# Patient Record
Sex: Female | Born: 1980 | Race: White | Hispanic: Yes | Marital: Married | Smoking: Never smoker
Health system: Southern US, Community
[De-identification: ages and names within clinical notes are randomized; demographics above are authoritative.]

## PROBLEM LIST (undated history)

## (undated) ENCOUNTER — Inpatient Hospital Stay (HOSPITAL_COMMUNITY): Payer: Self-pay

## (undated) DIAGNOSIS — B999 Unspecified infectious disease: Secondary | ICD-10-CM

## (undated) DIAGNOSIS — R87610 Atypical squamous cells of undetermined significance on cytologic smear of cervix (ASC-US): Secondary | ICD-10-CM

## (undated) DIAGNOSIS — K8020 Calculus of gallbladder without cholecystitis without obstruction: Secondary | ICD-10-CM

## (undated) DIAGNOSIS — R102 Pelvic and perineal pain: Secondary | ICD-10-CM

## (undated) DIAGNOSIS — M5136 Other intervertebral disc degeneration, lumbar region: Secondary | ICD-10-CM

## (undated) DIAGNOSIS — M940 Chondrocostal junction syndrome [Tietze]: Secondary | ICD-10-CM

## (undated) DIAGNOSIS — M7918 Myalgia, other site: Secondary | ICD-10-CM

## (undated) HISTORY — DX: Myalgia, other site: M79.18

## (undated) HISTORY — PX: REFRACTIVE SURGERY: SHX103

## (undated) HISTORY — PX: WISDOM TOOTH EXTRACTION: SHX21

## (undated) HISTORY — DX: Other intervertebral disc degeneration, lumbar region: M51.36

## (undated) HISTORY — DX: Calculus of gallbladder without cholecystitis without obstruction: K80.20

## (undated) HISTORY — DX: Pelvic and perineal pain: R10.2

## (undated) HISTORY — DX: Atypical squamous cells of undetermined significance on cytologic smear of cervix (ASC-US): R87.610

## (undated) HISTORY — PX: NO PAST SURGERIES: SHX2092

## (undated) HISTORY — DX: Chondrocostal junction syndrome (tietze): M94.0

## (undated) HISTORY — PX: EYE SURGERY: SHX253

---

## 2009-07-24 ENCOUNTER — Emergency Department (HOSPITAL_COMMUNITY): Admission: EM | Admit: 2009-07-24 | Discharge: 2009-07-24 | Payer: Self-pay | Admitting: Emergency Medicine

## 2009-11-02 ENCOUNTER — Inpatient Hospital Stay (HOSPITAL_COMMUNITY): Admission: AD | Admit: 2009-11-02 | Discharge: 2009-11-04 | Payer: Self-pay | Admitting: Obstetrics and Gynecology

## 2009-12-05 ENCOUNTER — Ambulatory Visit: Admission: RE | Admit: 2009-12-05 | Discharge: 2009-12-05 | Payer: Self-pay | Admitting: Obstetrics and Gynecology

## 2010-04-17 LAB — CBC
HCT: 34.6 % — ABNORMAL LOW (ref 36.0–46.0)
Hemoglobin: 12.3 g/dL (ref 12.0–15.0)
MCHC: 33.1 g/dL (ref 30.0–36.0)
Platelets: 166 10*3/uL (ref 150–400)
Platelets: 191 10*3/uL (ref 150–400)
RDW: 13.7 % (ref 11.5–15.5)
WBC: 15.9 10*3/uL — ABNORMAL HIGH (ref 4.0–10.5)

## 2010-04-17 LAB — RPR: RPR Ser Ql: NONREACTIVE

## 2011-06-11 ENCOUNTER — Telehealth: Payer: Self-pay | Admitting: Obstetrics and Gynecology

## 2011-06-11 NOTE — Telephone Encounter (Signed)
Pt co of pelvic pain mainly rt side started in Nov usually worse @ time of cycle but she is mid cycle now and getting worse no abn blding or d/c no problems with urination very concerned.advised pt she could try ibuprofen and we can schedule an appt forr eval scheduled with EP at 8:45a tomorrow [id number]

## 2011-06-12 ENCOUNTER — Ambulatory Visit (INDEPENDENT_AMBULATORY_CARE_PROVIDER_SITE_OTHER): Payer: Self-pay | Admitting: Obstetrics and Gynecology

## 2011-06-12 ENCOUNTER — Encounter: Payer: Self-pay | Admitting: Obstetrics and Gynecology

## 2011-06-12 VITALS — BP 100/70 | Temp 98.6°F | Ht 67.0 in | Wt 190.0 lb

## 2011-06-12 DIAGNOSIS — R102 Pelvic and perineal pain: Secondary | ICD-10-CM

## 2011-06-12 DIAGNOSIS — R21 Rash and other nonspecific skin eruption: Secondary | ICD-10-CM

## 2011-06-12 DIAGNOSIS — N949 Unspecified condition associated with female genital organs and menstrual cycle: Secondary | ICD-10-CM

## 2011-06-12 LAB — POCT URINALYSIS DIPSTICK
Ketones, UA: NEGATIVE
Leukocytes, UA: NEGATIVE
Nitrite, UA: NEGATIVE
Protein, UA: NEGATIVE
Urobilinogen, UA: NEGATIVE

## 2011-06-12 LAB — POCT URINE PREGNANCY: Preg Test, Ur: NEGATIVE

## 2011-06-12 NOTE — Patient Instructions (Signed)
Schedule follow up visit after ultrasound

## 2011-06-12 NOTE — Progress Notes (Signed)
Vaginal Discharge: no Kidney Stones: no Prior Eval: no  Odor: no Constipation: no Prior U/S: no  Fever: no Diarrhea: no Hx of Ovarian Cyst: no  Irreg. Periods: no Rectal Bleeding: no Hx of STD-PID: no  Dyspareunia: no Vomiting: no Appendectomy: no   Dysuria: no Nausea: no Gall Bladder Ds: no  Frequency: no Pregnant: no Other: C/O RIGHT SIDE PAIN  Urgency: no Fibroids: no   Hematuria: no Endometriosis: no

## 2011-06-12 NOTE — Progress Notes (Addendum)
30 YO complains of 1 year history of pelvic pain. Descirbes the pain as achy, off & on, mostly on the right and worse with full bladder and certain movements (sudden or bending down).  Denies changes on bowel movements, dyspareunia, back pain, or dysuria.  Hasn't taken medication for the pain and at times associated with menses lasting several days. Rates the pain as 7-8/10.  Denies urgency, frequency or nocturia.  Admits to holding urine often. Lastly for the past 6 months has had an itchy rash on inside of left lower leg.  Has not responded to hydrocortisone.  O: Abdomen: soft, non-tender, no organomegaly      Pelvic: EGBUS-wnl, vagina-normal rugae, cervix-no       tenderness, uterus-normal size, adnexae-no tender-      ness or masses         Back: no CVA tenderness     Left lower leg: medially a raised confluent mauve colored     rash in somewhat linear distribution but spanning a 3 cm     area.  U/A: negative UPT: negative  A:  Chronic Pelvic Pain       Left leg rash  P: Reviewed causes of pelvic pain     Pelvic Ultrasound to r/o pelvic masses     Refer to dermatiologist  for left  leg rash

## 2011-06-12 NOTE — Progress Notes (Signed)
Addended by: Henreitta Leber on: 06/12/2011 09:32 AM   Modules accepted: Orders

## 2011-06-23 ENCOUNTER — Ambulatory Visit (INDEPENDENT_AMBULATORY_CARE_PROVIDER_SITE_OTHER): Payer: Self-pay

## 2011-06-23 ENCOUNTER — Ambulatory Visit (INDEPENDENT_AMBULATORY_CARE_PROVIDER_SITE_OTHER): Payer: Self-pay | Admitting: Obstetrics and Gynecology

## 2011-06-23 ENCOUNTER — Other Ambulatory Visit: Payer: Self-pay | Admitting: Obstetrics and Gynecology

## 2011-06-23 ENCOUNTER — Encounter: Payer: Self-pay | Admitting: Obstetrics and Gynecology

## 2011-06-23 VITALS — BP 110/72 | Temp 98.5°F | Ht 67.0 in | Wt 190.0 lb

## 2011-06-23 DIAGNOSIS — N949 Unspecified condition associated with female genital organs and menstrual cycle: Secondary | ICD-10-CM

## 2011-06-23 DIAGNOSIS — R102 Pelvic and perineal pain: Secondary | ICD-10-CM

## 2011-06-23 DIAGNOSIS — G8929 Other chronic pain: Secondary | ICD-10-CM | POA: Insufficient documentation

## 2011-06-23 MED ORDER — NORGESTIMATE-ETH ESTRADIOL 0.25-35 MG-MCG PO TABS: 1.0000 | ORAL_TABLET | Freq: Every day | ORAL | Status: DC

## 2011-06-23 NOTE — Progress Notes (Signed)
30 YO seen 06/12/11 for chronic pelvic pain returns for ultrasound follow up.    U/S-wnl  A: Chronic Pelvic Pain  P: Discontinue Micronor and  begin with next cycle, Sprintec     #1 1 po qd 11 refills (patient wanted to switch due to cost     of current pills and because she has done well with      decreased flow when she was on Low-Ogestrel     Reviewed causes of pelvic pain: urogenital, previous              surgery, gastrointestinal and musculoskeletal.      Given information to obtain a PCP, follow up with PCP     for pelvic pain  Amiyah Shryock, PA-C

## 2012-07-06 ENCOUNTER — Encounter (INDEPENDENT_AMBULATORY_CARE_PROVIDER_SITE_OTHER): Payer: Self-pay

## 2012-07-08 ENCOUNTER — Ambulatory Visit (INDEPENDENT_AMBULATORY_CARE_PROVIDER_SITE_OTHER): Payer: 59 | Admitting: General Surgery

## 2012-07-08 ENCOUNTER — Encounter (INDEPENDENT_AMBULATORY_CARE_PROVIDER_SITE_OTHER): Payer: Self-pay | Admitting: General Surgery

## 2012-07-08 ENCOUNTER — Telehealth (INDEPENDENT_AMBULATORY_CARE_PROVIDER_SITE_OTHER): Payer: Self-pay | Admitting: General Surgery

## 2012-07-08 VITALS — BP 110/70 | HR 71 | Temp 98.9°F | Resp 16 | Ht 67.0 in | Wt 191.6 lb

## 2012-07-08 DIAGNOSIS — R1030 Lower abdominal pain, unspecified: Secondary | ICD-10-CM

## 2012-07-08 DIAGNOSIS — R109 Unspecified abdominal pain: Secondary | ICD-10-CM

## 2012-07-08 NOTE — Telephone Encounter (Signed)
Faxed med release to [facility] [phone] to received CT scan disc itself..told that we should received in mail sometime next week

## 2012-07-08 NOTE — Telephone Encounter (Signed)
LMOM call me back today 6/6 to see if patient could maybe come in this a.m instead of this p.m.

## 2012-07-08 NOTE — Progress Notes (Signed)
Patient ID: Erika Gay, female   DOB: April 29, 1980, 32 y.o.   MRN: [id number]  Chief Complaint  Patient presents with  . New Evaluation    evaluate abd pain / ? hernia    HPI Erika Gay is a 32 y.o. female.  The patient is a 32 year old female who is referred by Dr. Emily Filbert for an evaluation of a potential right inguinal hernia. The patient states she has had approximately a one year and one half year history of right inguinal pain. She states the pain is increased at the time of her monthly cycles. She stated that recently the pain has been more consistent even after cycles are over.  Additional states that her pain is increased with lifting something heavy sneezing and/or coughing. The patient underwent a CT scan which I did not half-year office her the report does not mention any fibroids and is not comment on any hernia in the right inguinal area. The patient states she's never had any back symptoms and or problems and has never seen a spine doctor. HPI  Past Medical History  Diagnosis Date  . Pelvic pain     Past Surgical History  Procedure Laterality Date  . Wisdom tooth extraction    . Refractive surgery      Family History  Problem Relation Age of Onset  . Hypertension Mother   . Diabetes Other     Social History History  Substance Use Topics  . Smoking status: Never Smoker   . Smokeless tobacco: Never Used  . Alcohol Use: No    No Known Allergies  Current Outpatient Prescriptions  Medication Sig Dispense Refill  . Multiple Vitamin (MULTIVITAMIN) tablet Take 1 tablet by mouth daily.      . naproxen (NAPROSYN) 500 MG tablet Take 500 mg by mouth 2 (two) times daily with a meal.      . norgestimate-ethinyl estradiol (SPRINTEC 28) 0.25-35 MG-MCG tablet Take 1 tablet by mouth daily.  1 Package  11   No current facility-administered medications for this visit.    Review of Systems Review of Systems  Constitutional: Negative.   HENT: Negative.    Respiratory: Negative.   Cardiovascular: Negative.   Gastrointestinal: Negative.   Endocrine: Negative.   Neurological: Negative.   All other systems reviewed and are negative.    Blood pressure 110/70, pulse 71, temperature 98.9 F (37.2 C), temperature source Temporal, resp. rate 16, height 5\' 7"  (1.702 m), weight 191 lb 9.6 oz (86.909 kg), SpO2 100.00%.  Physical Exam Physical Exam  Constitutional: She is oriented to person, place, and time. She appears well-developed and well-nourished.  HENT:  Head: Normocephalic and atraumatic.  Eyes: Conjunctivae and EOM are normal. Pupils are equal, round, and reactive to light.  Neck: Normal range of motion. Neck supple.  Cardiovascular: Normal rate, regular rhythm and normal heart sounds.   Pulmonary/Chest: Effort normal and breath sounds normal.  Abdominal: Soft. Bowel sounds are normal. No hernia. Hernia confirmed negative in the right inguinal area and confirmed negative in the left inguinal area.  Musculoskeletal: Normal range of motion.  Neurological: She is alert and oriented to person, place, and time.  Skin: Skin is warm and dry.    Data Reviewed CT scan report reviewed. No CT CD and hand  Assessment    32 year old female with right inguinal pain. I not feel a hernia on exam. Due to the patient's symptomatology and correlation with her monthly cycles it is possible she could also he had issues with  endometriosis.    Plan     1. We will have the CD sent here for review of possible hernia in the right inguinal canal.should this be negative I think the best course would be for the patient is a GYN physician can be worked up and potentially treated for endometriosis. The patient understands and is not want unnecessary operation for a hernia if it's not there.  2. I will call the patient back after reviewing the CT scan.       Marigene Ehlers., Naz Denunzio 07/08/2012, 3:53 PM

## 2012-07-15 ENCOUNTER — Ambulatory Visit (INDEPENDENT_AMBULATORY_CARE_PROVIDER_SITE_OTHER): Payer: 59 | Admitting: Surgery

## 2012-07-18 ENCOUNTER — Telehealth (INDEPENDENT_AMBULATORY_CARE_PROVIDER_SITE_OTHER): Payer: Self-pay | Admitting: General Surgery

## 2012-07-18 NOTE — Telephone Encounter (Signed)
faxed new med rel to pt to sign and fax back to me so that I can refax to [facility] so that they can mail the CT disc to [place]..fax number for pt is [phone]...spoke with Arline Asp from [facility] to ask why we had not yet received the CT and she stated that they had not mailed it because they did not show a med rel in their system...original med rel sent to med recds to be scanned in with confirmation from 07/08/12

## 2012-07-18 NOTE — Telephone Encounter (Signed)
error 

## 2012-07-18 NOTE — Telephone Encounter (Signed)
Patient called in and stated that she had been on the phone with [facility] and they had stated they found the med release that was faxed over on 6/6 and that they were mailing the CT out today and we should have it either 6/17 or 6/18.Marland Kitchenpt is aware that we will call once we receive and review the images..patient is agreeable

## 2012-07-18 NOTE — Telephone Encounter (Signed)
Error

## 2012-07-25 ENCOUNTER — Telehealth (INDEPENDENT_AMBULATORY_CARE_PROVIDER_SITE_OTHER): Payer: Self-pay | Admitting: General Surgery

## 2012-07-25 NOTE — Telephone Encounter (Signed)
Patient called asking if result were in from her CT. I did advise patient we just received the disc, Dr.Ramirez will be in 07/26/2012 to review. Patient agreeable.

## 2012-07-26 ENCOUNTER — Telehealth (INDEPENDENT_AMBULATORY_CARE_PROVIDER_SITE_OTHER): Payer: Self-pay | Admitting: General Surgery

## 2012-07-26 NOTE — Telephone Encounter (Signed)
I called the patient after reviewing her CT scan which reveals no right inguinal hernia. Patient states at this point she has no pain in her abdomen and right lower inguinal area.  At this time the best course of actually be for her to visit with her GYN positions is any medications that can help her with possible endometriosis treatment. Again the patient did not 1 have any undue surgery especially for hernia she did have one which we ruled out review the CT scan.

## 2013-12-04 ENCOUNTER — Encounter (INDEPENDENT_AMBULATORY_CARE_PROVIDER_SITE_OTHER): Payer: Self-pay | Admitting: General Surgery

## 2014-11-02 ENCOUNTER — Emergency Department (HOSPITAL_COMMUNITY): Payer: 59

## 2014-11-02 ENCOUNTER — Emergency Department (HOSPITAL_COMMUNITY)
Admission: EM | Admit: 2014-11-02 | Discharge: 2014-11-02 | Disposition: A | Discharge status: Home / Self Care | Payer: 59 | Attending: Emergency Medicine | Admitting: Emergency Medicine

## 2014-11-02 ENCOUNTER — Encounter (HOSPITAL_COMMUNITY): Payer: Self-pay | Admitting: Emergency Medicine

## 2014-11-02 DIAGNOSIS — S4991XA Unspecified injury of right shoulder and upper arm, initial encounter: Secondary | ICD-10-CM | POA: Diagnosis not present

## 2014-11-02 DIAGNOSIS — M545 Low back pain, unspecified: Secondary | ICD-10-CM

## 2014-11-02 DIAGNOSIS — M25531 Pain in right wrist: Secondary | ICD-10-CM

## 2014-11-02 DIAGNOSIS — Z791 Long term (current) use of non-steroidal anti-inflammatories (NSAID): Secondary | ICD-10-CM | POA: Insufficient documentation

## 2014-11-02 DIAGNOSIS — S8001XA Contusion of right knee, initial encounter: Secondary | ICD-10-CM | POA: Diagnosis not present

## 2014-11-02 DIAGNOSIS — S199XXA Unspecified injury of neck, initial encounter: Secondary | ICD-10-CM | POA: Diagnosis present

## 2014-11-02 DIAGNOSIS — Z79899 Other long term (current) drug therapy: Secondary | ICD-10-CM | POA: Diagnosis not present

## 2014-11-02 DIAGNOSIS — M25561 Pain in right knee: Secondary | ICD-10-CM

## 2014-11-02 DIAGNOSIS — S3992XA Unspecified injury of lower back, initial encounter: Secondary | ICD-10-CM | POA: Diagnosis not present

## 2014-11-02 DIAGNOSIS — Y998 Other external cause status: Secondary | ICD-10-CM | POA: Insufficient documentation

## 2014-11-02 DIAGNOSIS — Y9389 Activity, other specified: Secondary | ICD-10-CM | POA: Diagnosis not present

## 2014-11-02 DIAGNOSIS — S6991XA Unspecified injury of right wrist, hand and finger(s), initial encounter: Secondary | ICD-10-CM | POA: Diagnosis not present

## 2014-11-02 DIAGNOSIS — Z3202 Encounter for pregnancy test, result negative: Secondary | ICD-10-CM | POA: Insufficient documentation

## 2014-11-02 DIAGNOSIS — S161XXA Strain of muscle, fascia and tendon at neck level, initial encounter: Secondary | ICD-10-CM

## 2014-11-02 DIAGNOSIS — Y92410 Unspecified street and highway as the place of occurrence of the external cause: Secondary | ICD-10-CM | POA: Insufficient documentation

## 2014-11-02 LAB — POC URINE PREG, ED: Preg Test, Ur: NEGATIVE

## 2014-11-02 MED ORDER — NAPROXEN 500 MG PO TABS: 500.0000 mg | ORAL_TABLET | Freq: Two times a day (BID) | ORAL | Status: DC

## 2014-11-02 MED ORDER — CYCLOBENZAPRINE HCL 10 MG PO TABS: 10.0000 mg | ORAL_TABLET | Freq: Two times a day (BID) | ORAL | Status: DC | PRN

## 2014-11-02 NOTE — ED Notes (Signed)
Patient transported to X-ray 
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
## 2014-11-09 ENCOUNTER — Ambulatory Visit (INDEPENDENT_AMBULATORY_CARE_PROVIDER_SITE_OTHER): Payer: 59 | Admitting: Physician Assistant

## 2014-11-09 VITALS — BP 104/80 | HR 101 | Temp 98.2°F | Resp 16 | Ht 66.5 in | Wt 199.8 lb

## 2014-11-09 DIAGNOSIS — Z23 Encounter for immunization: Secondary | ICD-10-CM

## 2014-11-09 DIAGNOSIS — Z111 Encounter for screening for respiratory tuberculosis: Secondary | ICD-10-CM | POA: Diagnosis not present

## 2014-11-09 NOTE — Progress Notes (Signed)

## 2014-11-09 NOTE — Patient Instructions (Signed)
You received the flu vaccine today.  We placed the TB skin screening test today. You'll need to return in 48-72 hours to have this test read.   Tuberculin Skin Test WHY AM I HAVING THIS TEST? Tuberculosis (TB) is a bacterial infection caused by Mycobacterium tuberculosis. Most people who are exposed to these bacteria have a strong enough defense (immune) system to prevent the bacteria from causing TB and developing symptoms. Their bodies prevent the germs from being active and making them sick (latent TB infection).  However, if you have TB germs in your body and your immune system is weak, you can develop a TB infection. This can cause symptoms such as:   Night sweats.  Fever.  Weakness.  Weight loss. A latent TB infection can also become active later in life if your immune system becomes weakened or compromised. You may have this test if your health care provider suspects that you have TB. You may also have this test to screen for TB if you are at risk for getting the disease. Those at increased risk include:  People who inject illegal drugs or share needles.  People with HIV or other diseases that affect immunity.  Health care workers.  People who live in high-risk communities, such as homeless shelters, nursing homes, and correctional facilities.  People who have been in contact with someone with TB.  People from countries where TB is more common. If you are in a high-risk group, your health care provider may wish to screen for TB more often. This can help prevent the spread of the disease. Sometimes TB screening is required when starting a new job, such as becoming a Scientist, forensic or a Runner, broadcasting/film/video. Colleges or universities may require it of new students. HOW WILL I BE TESTED? A tuberculin skin test is the main test used to check for exposure to the bacteria that can cause TB. The test checks for antibodies to the bacteria. Antibodies are proteins that your body produces to  protect you from germs and other things that can make you sick. Your health care provider will inject a solution known as PPD (purified protein derivative) under the first layer of skin on your arm. This causes a blister-like bubble to form at the site. Your health care provider will then examine the site after a number of hours have passed to see if a reaction has occurred. HOW DO I PREPARE FOR THE TEST? There is no preparation required for this test. WHAT DO THE RESULTS MEAN? Your test results will be reported as either negative or positive.  If the tuberculin skin test produces a negative result, it is likely that you do not have TB and have not been exposed to the TB bacteria. If you or your health care provider suspects exposure, however, you may want to repeat the test a few weeks later. A blood test may also be used to check for TB. This is because you will not react to the tuberculin skin test until several weeks after exposure to TB bacteria. If you test positive to the tuberculin skin test, it is likely that you have been exposed to TB bacteria. The test does not distinguish between an active and a latent TB infection. A false-positive result can occur. A false-positive result for TB bacteria is incorrect because it indicates a condition or finding is present when it is not. Talk to your health care provider to discuss your results, treatment options, and if necessary, the need for more  tests. It is your responsibility to obtain your test results. Ask the lab or department performing the test when and how you will get your results. Talk with your health care provider if you have any questions about your results.   This information is not intended to replace advice given to you by your health care provider. Make sure you discuss any questions you have with your health care provider.   Document Released: 10/29/2004 Document Revised: 02/09/2014 Document Reviewed: 05/15/2013 Elsevier  Interactive Patient Education 2016 Elsevier Inc.   Influenza Virus Vaccine (Flucelvax) What is this medicine? INFLUENZA VIRUS VACCINE (in floo EN zuh VAHY ruhs vak SEEN) helps to reduce the risk of getting influenza also known as the flu. The vaccine only helps protect you against some strains of the flu. This medicine may be used for other purposes; ask your health care provider or pharmacist if you have questions. What should I tell my health care provider before I take this medicine? They need to know if you have any of these conditions: -bleeding disorder like hemophilia -fever or infection -Guillain-Barre syndrome or other neurological problems -immune system problems -infection with the human immunodeficiency virus (HIV) or AIDS -low blood platelet counts -multiple sclerosis -an unusual or allergic reaction to influenza virus vaccine, other medicines, foods, dyes or preservatives -pregnant or trying to get pregnant -breast-feeding How should I use this medicine? This vaccine is for injection into a muscle. It is given by a health care professional. A copy of Vaccine Information Statements will be given before each vaccination. Read this sheet carefully each time. The sheet may change frequently. Talk to your pediatrician regarding the use of this medicine in children. Special care may be needed. Overdosage: If you think you've taken too much of this medicine contact a poison control center or emergency room at once. Overdosage: If you think you have taken too much of this medicine contact a poison control center or emergency room at once. NOTE: This medicine is only for you. Do not share this medicine with others. What if I miss a dose? This does not apply. What may interact with this medicine? -chemotherapy or radiation therapy -medicines that lower your immune system like etanercept, anakinra, infliximab, and adalimumab -medicines that treat or prevent blood clots like  warfarin -phenytoin -steroid medicines like prednisone or cortisone -theophylline -vaccines This list may not describe all possible interactions. Give your health care provider a list of all the medicines, herbs, non-prescription drugs, or dietary supplements you use. Also tell them if you smoke, drink alcohol, or use illegal drugs. Some items may interact with your medicine. What should I watch for while using this medicine? Report any side effects that do not go away within 3 days to your doctor or health care professional. Call your health care provider if any unusual symptoms occur within 6 weeks of receiving this vaccine. You may still catch the flu, but the illness is not usually as bad. You cannot get the flu from the vaccine. The vaccine will not protect against colds or other illnesses that may cause fever. The vaccine is needed every year. What side effects may I notice from receiving this medicine? Side effects that you should report to your doctor or health care professional as soon as possible: -allergic reactions like skin rash, itching or hives, swelling of the face, lips, or tongue Side effects that usually do not require medical attention (Report these to your doctor or health care professional if they continue or are  bothersome.): -fever -headache -muscle aches and pains -pain, tenderness, redness, or swelling at the injection site -tiredness This list may not describe all possible side effects. Call your doctor for medical advice about side effects. You may report side effects to FDA at 1-800-FDA-1088. Where should I keep my medicine? The vaccine will be given by a health care professional in a clinic, pharmacy, doctor's office, or other health care setting. You will not be given vaccine doses to store at home. NOTE: This sheet is a summary. It may not cover all possible information. If you have questions about this medicine, talk to your doctor, pharmacist, or health care  provider.    2016, Elsevier/Gold Standard. (2010-12-31 14:06:47)

## 2014-11-09 NOTE — Progress Notes (Signed)
   Subjective:    Patient ID: Erika Gay, female    DOB: 07-06-1980, 34 y.o.   MRN: [id number]  Chief Complaint  Patient presents with  . PPD Placement    for work  . Flu Vaccine   Medications, allergies, past medical history, surgical history, family history, social history and problem list reviewed and updated.  HPI  34 yof presents needing flu vaccine and ppd placement.   Denies fevers, chills, n/v, diarrhea.   Review of Systems See HPI     Objective:   Physical Exam  Constitutional: She is oriented to person, place, and time. She appears well-developed and well-nourished.  Non-toxic appearance. She does not have a sickly appearance. She does not appear ill. No distress.  BP 104/80 mmHg  Pulse 101  Temp(Src) 98.2 F (36.8 C) (Oral)  Resp 16  Ht 5' 6.5" (1.689 m)  Wt 199 lb 12.8 oz (90.629 kg)  BMI 31.77 kg/m2  SpO2 99%  LMP 11/09/2014   Neurological: She is alert and oriented to person, place, and time.  Psychiatric: She has a normal mood and affect. Her speech is normal and behavior is normal.      Assessment & Plan:   Flu vaccine need - Plan: Flu Vaccine QUAD 36+ mos IM  Encounter for PPD test - Plan: TB Skin Test --flu vaccine given --ppd placed, rtc 48-72 hrs  Donnajean Lopes, PA-C Physician Assistant-Certified [facility]  11/09/2014 2:35 PM

## 2014-11-11 ENCOUNTER — Ambulatory Visit (INDEPENDENT_AMBULATORY_CARE_PROVIDER_SITE_OTHER): Payer: 59

## 2014-11-11 DIAGNOSIS — Z111 Encounter for screening for respiratory tuberculosis: Secondary | ICD-10-CM

## 2014-11-11 LAB — TB SKIN TEST
INDURATION: 0 mm
TB Skin Test: NEGATIVE

## 2014-11-11 NOTE — Progress Notes (Signed)
   Subjective:    Patient ID: Erika Gay, female    DOB: Jan 07, 1981, 34 y.o.   MRN: [id number]  HPI Patient was here for a PPD read. The results were negative and the induration was 0 mm.   Review of Systems     Objective:   Physical Exam        Assessment & Plan:

## 2014-12-13 ENCOUNTER — Other Ambulatory Visit (HOSPITAL_COMMUNITY): Payer: Self-pay | Admitting: Chiropractor

## 2014-12-13 DIAGNOSIS — M5126 Other intervertebral disc displacement, lumbar region: Secondary | ICD-10-CM

## 2014-12-21 ENCOUNTER — Ambulatory Visit (HOSPITAL_COMMUNITY)
Admission: RE | Admit: 2014-12-21 | Discharge: 2014-12-21 | Disposition: A | Discharge status: Home / Self Care | Payer: 59 | Source: Ambulatory Visit | Attending: Chiropractor | Admitting: Chiropractor

## 2014-12-21 DIAGNOSIS — M129 Arthropathy, unspecified: Secondary | ICD-10-CM | POA: Diagnosis not present

## 2014-12-21 DIAGNOSIS — M545 Low back pain: Secondary | ICD-10-CM | POA: Insufficient documentation

## 2014-12-21 DIAGNOSIS — M5127 Other intervertebral disc displacement, lumbosacral region: Secondary | ICD-10-CM | POA: Diagnosis not present

## 2014-12-21 DIAGNOSIS — M5126 Other intervertebral disc displacement, lumbar region: Secondary | ICD-10-CM

## 2015-05-24 DIAGNOSIS — M7918 Myalgia, other site: Secondary | ICD-10-CM

## 2015-05-24 HISTORY — DX: Myalgia, other site: M79.18

## 2015-06-05 ENCOUNTER — Other Ambulatory Visit: Payer: Self-pay | Admitting: Obstetrics and Gynecology

## 2015-06-05 DIAGNOSIS — S299XXA Unspecified injury of thorax, initial encounter: Secondary | ICD-10-CM

## 2015-06-11 ENCOUNTER — Ambulatory Visit
Admission: RE | Admit: 2015-06-11 | Discharge: 2015-06-11 | Disposition: A | Discharge status: Home / Self Care | Payer: 59 | Source: Ambulatory Visit | Attending: Obstetrics and Gynecology | Admitting: Obstetrics and Gynecology

## 2015-06-11 DIAGNOSIS — S299XXA Unspecified injury of thorax, initial encounter: Secondary | ICD-10-CM

## 2015-06-12 DIAGNOSIS — N6320 Unspecified lump in the left breast, unspecified quadrant: Secondary | ICD-10-CM | POA: Insufficient documentation

## 2015-08-01 ENCOUNTER — Encounter: Payer: Self-pay | Admitting: Sports Medicine

## 2015-08-01 ENCOUNTER — Ambulatory Visit (INDEPENDENT_AMBULATORY_CARE_PROVIDER_SITE_OTHER): Payer: 59 | Admitting: Sports Medicine

## 2015-08-01 VITALS — BP 113/73 | HR 89 | Wt 200.0 lb

## 2015-08-01 DIAGNOSIS — M5136 Other intervertebral disc degeneration, lumbar region: Secondary | ICD-10-CM | POA: Diagnosis not present

## 2015-08-01 DIAGNOSIS — M51369 Other intervertebral disc degeneration, lumbar region without mention of lumbar back pain or lower extremity pain: Secondary | ICD-10-CM

## 2015-08-01 DIAGNOSIS — M940 Chondrocostal junction syndrome [Tietze]: Secondary | ICD-10-CM | POA: Diagnosis not present

## 2015-08-01 HISTORY — DX: Other intervertebral disc degeneration, lumbar region: M51.36

## 2015-08-01 HISTORY — DX: Other intervertebral disc degeneration, lumbar region without mention of lumbar back pain or lower extremity pain: M51.369

## 2015-08-01 HISTORY — DX: Chondrocostal junction syndrome (tietze): M94.0

## 2015-08-01 MED ORDER — PREDNISONE 50 MG PO TABS: ORAL_TABLET | ORAL | Status: DC

## 2015-08-01 MED ORDER — MELOXICAM 15 MG PO TABS
ORAL_TABLET | ORAL | Status: DC
Start: 2015-08-01 — End: 2015-10-04

## 2015-08-01 NOTE — Assessment & Plan Note (Signed)
With L4-L5 and L5-S1 degenerative disc disease. Axial pain with discogenic without a radicular component. Prednisone, meloxicam, formal physical therapy.  Return in one month, epidural if no better.

## 2015-08-01 NOTE — Patient Instructions (Signed)
Costochondritis °Costochondritis, sometimes called Tietze syndrome, is a swelling and irritation (inflammation) of the tissue (cartilage) that connects your ribs with your breastbone (sternum). It causes pain in the chest and rib area. Costochondritis usually goes away on its own over time. It can take up to 6 weeks or longer to get better, especially if you are unable to limit your activities. °CAUSES  °Some cases of costochondritis have no known cause. Possible causes include: °· Injury (trauma). °· Exercise or activity such as lifting. °· Severe coughing. °SIGNS AND SYMPTOMS °· Pain and tenderness in the chest and rib area. °· Pain that gets worse when coughing or taking deep breaths. °· Pain that gets worse with specific movements. °DIAGNOSIS  °Your health care provider will do a physical exam and ask about your symptoms. Chest X-rays or other tests may be done to rule out other problems. °TREATMENT  °Costochondritis usually goes away on its own over time. Your health care provider may prescribe medicine to help relieve pain. °HOME CARE INSTRUCTIONS  °· Avoid exhausting physical activity. Try not to strain your ribs during normal activity. This would include any activities using chest, abdominal, and side muscles, especially if heavy weights are used. °· Apply ice to the affected area for the first 2 days after the pain begins. °¨ Put ice in a plastic bag. °¨ Place a towel between your skin and the bag. °¨ Leave the ice on for 20 minutes, 2-3 times a day. °· Only take over-the-counter or prescription medicines as directed by your health care provider. °SEEK MEDICAL CARE IF: °· You have redness or swelling at the rib joints. These are signs of infection. °· Your pain does not go away despite rest or medicine. °SEEK IMMEDIATE MEDICAL CARE IF:  °· Your pain increases or you are very uncomfortable. °· You have shortness of breath or difficulty breathing. °· You cough up blood. °· You have worse chest pains,  sweating, or vomiting. °· You have a fever or persistent symptoms for more than 2-3 days. °· You have a fever and your symptoms suddenly get worse. °MAKE SURE YOU:  °· Understand these instructions. °· Will watch your condition. °· Will get help right away if you are not doing well or get worse. °  °This information is not intended to replace advice given to you by your health care provider. Make sure you discuss any questions you have with your health care provider. °  °Document Released: 10/29/2004 Document Revised: 11/09/2012 Document Reviewed: 08/23/2012 °Elsevier Interactive Patient Education ©2016 Elsevier Inc. ° °
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
## 2015-08-01 NOTE — Assessment & Plan Note (Signed)
Meloxicam, return in one month for this.

## 2015-08-19 ENCOUNTER — Encounter: Payer: Self-pay | Admitting: Physical Therapy

## 2015-08-19 ENCOUNTER — Ambulatory Visit: Payer: 59 | Attending: Sports Medicine | Admitting: Physical Therapy

## 2015-08-19 DIAGNOSIS — M545 Low back pain, unspecified: Secondary | ICD-10-CM

## 2015-08-19 DIAGNOSIS — R252 Cramp and spasm: Secondary | ICD-10-CM | POA: Diagnosis present
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
## 2015-08-21 ENCOUNTER — Encounter: Payer: Self-pay | Admitting: Physical Therapy

## 2015-08-21 ENCOUNTER — Ambulatory Visit: Payer: 59 | Admitting: Physical Therapy

## 2015-08-21 DIAGNOSIS — R252 Cramp and spasm: Secondary | ICD-10-CM

## 2015-08-21 DIAGNOSIS — M545 Low back pain, unspecified: Secondary | ICD-10-CM

## 2015-08-21 NOTE — Therapy (Signed)
[facility] [place] [address] [place], [place], [address] Phone: [phone]   Fax:  [phone]  Physical Therapy Treatment  Patient Details  Name: Erika Gay MRN: [id number] Date of Birth: 03/17/1980 Referring Provider: Synetta Failhekkekandrum  Encounter Date: 08/21/2015      PT End of Session - 08/21/15 0853    Visit Number 2   Date for PT Re-Evaluation 10/20/15   PT Start Time 0847   PT Stop Time 0928   PT Time Calculation (min) 41 min   Activity Tolerance Patient tolerated treatment well   Behavior During Therapy [facility] for tasks assessed/performed      Past Medical History  Diagnosis Date  . Pelvic pain     Past Surgical History  Procedure Laterality Date  . Wisdom tooth extraction    . Refractive surgery      There were no vitals filed for this visit.      Subjective Assessment - 08/21/15 0850    Subjective Pt. reports she has been cleaning her house the last few days and has had increased pain in the low back with squating since then.  Pt. with initial 6/10 LBP today.     Patient Stated Goals have less pain   Currently in Pain? Yes   Pain Score 6    Pain Location Back   Pain Orientation Right;Left;Lower   Pain Descriptors / Indicators Aching;Sore;Dull   Pain Type Chronic pain   Pain Onset More than a month ago   Multiple Pain Sites No        Today's Treatment:  Therex: NuStep: level 4, 5 min  B HS, piri, glute, SKTC stretches Hooklying LE marching with blue TB around knees x 10 reps  Hooklying bridge x 10 reps Hooklying alternating hip abd/ER with blue TB x 10 reps B sidelying clam shell with blue TB x 10 reps   Straight leg bridge with heels on green p-ball x 10 rep   Quadruped alternating LE x 8 reps; with green p-ball under trunk for support; no wrist pain with this; pain down to a 4/10 in low back with this Qaudruped alternating LE/UE x 5 each; pain down to a 4/10 in low back with  this Standing scapular retraction with blue TB x 15 reps each  Standing scapular retraction / extension x 15 reps each     * pt. with 6/10 LBP throughout therex up to 8/10 LBP with hooklying bridge, SL bridge and quadruped extension activities more tolerable today           PT Short Term Goals - 08/19/15 1525    PT SHORT TERM GOAL #1   Title independent with initial HEP   Time 2   Period Weeks   Status New           PT Long Term Goals - 08/19/15 1525    PT LONG TERM GOAL #1   Title increase Lumbar ROM 25%   Time 8   Period Weeks   Status New   PT LONG TERM GOAL #2   Title decrease pain 50%   Time 8   Period Weeks   Status New   PT LONG TERM GOAL #3   Title understand posture and body mechanics   Time 8   Period Weeks   Status New   PT LONG TERM GOAL #4   Title tolerate standing for work without any issues   Time 8   Period Weeks  Status New               Plan - 08/21/15 8295    Clinical Impression Statement Pt. reports she has been cleaning her house the last few days and has had increased pain in the low back with squating since then.  Pt. with initial 6/10 LBP today.  Today's treatment focused on lumbopelvic strengthening / stretching activities with continued scapular retraction with TB to promote upright posture; Pt. reported increased LBP with flexion biased lumbopelvic activities and decreased pain with quadruped LE extension type activities today; suggest continued quadruped / extension biased activities in future treatments per pt. tolerance.     PT Treatment/Interventions ADLs/Self Care Home Management;Electrical Stimulation;Cryotherapy;Moist Heat;Therapeutic exercise;Therapeutic activities;Ultrasound;Traction;Neuromuscular re-education;Patient/family education;Manual techniques   PT Next Visit Plan progress exercises as tolerated, could try traction, modalities and educate on body mechanics      Patient will benefit from skilled therapeutic  intervention in order to improve the following deficits and impairments:  Decreased activity tolerance, Decreased mobility, Decreased range of motion, Difficulty walking, Decreased strength, Impaired flexibility, Increased muscle spasms, Pain, Improper body mechanics, Postural dysfunction  Visit Diagnosis: Midline low back pain without sciatica  Cramp and spasm     Problem List Patient Active Problem List   Diagnosis Date Noted  . Lumbar degenerative disc disease 08/01/2015  . Costochondritis 08/01/2015  . Chronic pelvic pain in female 06/23/2011    Kermit Balo, PTA 08/21/2015, 9:45 AM  [facility] [place] [address] [place], [place], [address] Phone: [phone]   Fax:  [phone]  Name: Erika Gay MRN: [id number] Date of Birth: [birth year]

## 2015-08-27 ENCOUNTER — Ambulatory Visit: Payer: 59 | Admitting: Physical Therapy

## 2015-08-27 DIAGNOSIS — R252 Cramp and spasm: Secondary | ICD-10-CM

## 2015-08-27 DIAGNOSIS — M545 Low back pain, unspecified: Secondary | ICD-10-CM

## 2015-08-27 NOTE — Therapy (Signed)
[facility] [place] [address] [place], [place], [address] Phone: [phone]   Fax:  [phone]  Physical Therapy Treatment  Patient Details  Name: Erika Gay MRN: [id number] Date of Birth: [birth year] Referring Provider: Synetta Fail  Encounter Date: 08/27/2015      PT End of Session - 08/27/15 0935    Visit Number 3   Date for PT Re-Evaluation 10/20/15   PT Start Time 0847   PT Stop Time 0942   PT Time Calculation (min) 55 min   Activity Tolerance Patient tolerated treatment well   Behavior During Therapy [facility] for tasks assessed/performed      Past Medical History:  Diagnosis Date  . Pelvic pain     Past Surgical History:  Procedure Laterality Date  . REFRACTIVE SURGERY    . WISDOM TOOTH EXTRACTION      There were no vitals filed for this visit.      Subjective Assessment - 08/27/15 0850    Subjective Pt reports that she was sore after last visit from stretches. Reports that this past week she has had a house full of people and hasn't done her sttretches at home, has been cleaning the house a lot.    Limitations Standing;Sitting   How long can you sit comfortably? 120 minutes   How long can you stand comfortably? 1 hour   Currently in Pain? Yes   Pain Score 7    Pain Location Back   Pain Orientation Lower   Pain Onset More than a month ago                         [facility] Adult PT Treatment/Exercise - 08/27/15 0001      Lumbar Exercises: Stretches   Active Hamstring Stretch 3 reps;10 seconds   Passive Hamstring Stretch 1 rep;10 seconds   Single Knee to Chest Stretch 20 seconds;1 rep   Double Knee to Chest Stretch 1 rep;20 seconds   Pelvic Tilt Limitations Pelvic clock on blue disc clockwise/counter clockwise x5 ea   Piriformis Stretch 1 rep;20 seconds     Lumbar Exercises: Standing   Scapular Retraction Strengthening;10 reps   Theraband Level (Scapular Retraction) Level 4  (Blue)   Shoulder Extension Strengthening;10 reps   Theraband Level (Shoulder Extension) Level 4 (Blue)     Lumbar Exercises: Supine   Straight Leg Raise 10 reps  On green pball    Other Supine Lumbar Exercises Trunk rotations on green physio ball x10,    Other Supine Lumbar Exercises Straight leg bridges on green pball x10     Lumbar Exercises: Quadruped   Opposite Arm/Leg Raise Right arm/Left leg;Left arm/Right leg;10 reps   Opposite Arm/Leg Raise Limitations On green pball, then without, x10     Knee/Hip Exercises: Aerobic   Nustep L 4 6 min     Knee/Hip Exercises: Seated   Ball Squeeze x10 in sitting   Clamshell with TheraBand Blue   Other Seated Knee/Hip Exercises Trunk extension with black tband x10   Abduction/Adduction  Strengthening;10 reps   Abd/Adduction Limitations Hip abd with blue tband x10     Moist Heat Therapy   Number Minutes Moist Heat 15 Minutes   Moist Heat Location Lumbar Spine     Electrical Stimulation   Electrical Stimulation Location Lumbar area   Electrical Stimulation Action IFC   Electrical Stimulation Goals Pain  PT Short Term Goals - 08/19/15 1525      PT SHORT TERM GOAL #1   Title independent with initial HEP   Time 2   Period Weeks   Status New           PT Long Term Goals - 08/19/15 1525      PT LONG TERM GOAL #1   Title increase Lumbar ROM 25%   Time 8   Period Weeks   Status New     PT LONG TERM GOAL #2   Title decrease pain 50%   Time 8   Period Weeks   Status New     PT LONG TERM GOAL #3   Title understand posture and body mechanics   Time 8   Period Weeks   Status New     PT LONG TERM GOAL #4   Title tolerate standing for work without any issues   Time 8   Period Weeks   Status New               Plan - 08/27/15 7121    Clinical Impression Statement Pt reports that having a full house and having to clean a lot has made her back hurt more. Pt completed quadruped exercise  without support of green physioball, had some pain in wrists but tolerable. Pt still has very tight HS bilaterally. Encouraged HEP to foster improved flexibility.     Rehab Potential Good   PT Frequency 2x / week   PT Duration 8 weeks   PT Treatment/Interventions ADLs/Self Care Home Management;Electrical Stimulation;Cryotherapy;Moist Heat;Therapeutic exercise;Therapeutic activities;Ultrasound;Traction;Neuromuscular re-education;Patient/family education;Manual techniques   PT Next Visit Plan Progress quadruped and other spinal stabilization exercises and work on body mechanics during lifting activities.       Patient will benefit from skilled therapeutic intervention in order to improve the following deficits and impairments:  Decreased activity tolerance, Decreased mobility, Decreased range of motion, Difficulty walking, Decreased strength, Impaired flexibility, Increased muscle spasms, Pain, Improper body mechanics, Postural dysfunction  Visit Diagnosis: Midline low back pain without sciatica  Cramp and spasm     Problem List Patient Active Problem List   Diagnosis Date Noted  . Lumbar degenerative disc disease 08/01/2015  . Costochondritis 08/01/2015  . Chronic pelvic pain in female 06/23/2011    Justus Memory, SPTA 08/27/2015, 9:42 AM  [facility] [place] [address] [place], [place], [address] Phone: [phone]   Fax:  [phone]  Name: Erika Gay MRN: [id number] Date of Birth: 07-May-1980

## 2015-08-28 ENCOUNTER — Ambulatory Visit: Payer: 59 | Admitting: Physical Therapy

## 2015-08-28 DIAGNOSIS — M545 Low back pain, unspecified: Secondary | ICD-10-CM

## 2015-08-28 DIAGNOSIS — R252 Cramp and spasm: Secondary | ICD-10-CM

## 2015-08-28 NOTE — Therapy (Signed)
[facility] [place] [address] [place], [place], [address] Phone: [phone]   Fax:  [phone]  Physical Therapy Evaluation  Patient Details  Name: Erika Gay MRN: [id number] Date of Birth: 12-15-1980 Referring Provider: Synetta Fail  Encounter Date: 08/28/2015      PT End of Session - 08/28/15 1645    Visit Number 4   Date for PT Re-Evaluation 10/20/15   PT Start Time 1603   PT Stop Time 1700   PT Time Calculation (min) 57 min   Activity Tolerance Patient tolerated treatment well   Behavior During Therapy [facility] for tasks assessed/performed      Past Medical History:  Diagnosis Date  . Pelvic pain     Past Surgical History:  Procedure Laterality Date  . REFRACTIVE SURGERY    . WISDOM TOOTH EXTRACTION      There were no vitals filed for this visit.       Subjective Assessment - 08/28/15 1604    Subjective Pt reports that she is still sore and that she has not had time to continue with HEP stretches.    Currently in Pain? Yes   Pain Score 7    Pain Location Back   Pain Orientation Lower                       [facility] Adult PT Treatment/Exercise - 08/28/15 0001      Lumbar Exercises: Stretches   Passive Hamstring Stretch 3 reps;10 seconds   Single Knee to Chest Stretch 3 reps;10 seconds   Double Knee to Chest Stretch 3 reps;10 seconds   Pelvic Tilt Limitations Pelvic clock on blue disc clockwise/counter clockwise x5 ea     Lumbar Exercises: Aerobic   Tread Mill nustep Level 4 x 7 minutes     Lumbar Exercises: Standing   Scapular Retraction Strengthening   Theraband Level (Scapular Retraction) Level 4 (Blue)     Lumbar Exercises: Supine   Straight Leg Raise 10 reps   Straight Leg Raises Limitations From green pball   Other Supine Lumbar Exercises Trunk rotations on green physio ball x10,      Knee/Hip Exercises: Seated   Ball Squeeze 2x10   Clamshell with TheraBand Blue    Other Seated Knee/Hip Exercises Trunk extension with black tband x10   Abduction/Adduction  Strengthening;10 reps   Abd/Adduction Limitations Hip abd with blue tband x10     Moist Heat Therapy   Number Minutes Moist Heat 15 Minutes   Moist Heat Location Lumbar Spine     Electrical Stimulation   Electrical Stimulation Location lumb   Electrical Stimulation Action IFC   Electrical Stimulation Goals Pain     Neck Exercises: Stretches   Upper Trapezius Stretch 3 reps;10 seconds  Pt sitting in chair                   PT Short Term Goals - 08/19/15 1525      PT SHORT TERM GOAL #1   Title independent with initial HEP   Time 2   Period Weeks   Status New           PT Long Term Goals - 08/19/15 1525      PT LONG TERM GOAL #1   Title increase Lumbar ROM 25%   Time 8   Period Weeks   Status New     PT LONG TERM GOAL #2   Title decrease pain 50%  Time 8   Period Weeks   Status New     PT LONG TERM GOAL #3   Title understand posture and body mechanics   Time 8   Period Weeks   Status New     PT LONG TERM GOAL #4   Title tolerate standing for work without any issues   Time 8   Period Weeks   Status New               Plan - 08/28/15 1646    Clinical Impression Statement Pt reports that she still feels sore and tight. Faciltated trap stretch bilaterally with pt in chair, pt stated that it helped. Pt tolerated quadruped ther ex without support of ball, although expressed some discomfort in her wrists.    Rehab Potential Good   PT Frequency 2x / week   PT Duration 8 weeks   PT Treatment/Interventions ADLs/Self Care Home Management;Electrical Stimulation;Cryotherapy;Moist Heat;Therapeutic exercise;Therapeutic activities;Ultrasound;Traction;Neuromuscular re-education;Patient/family education;Manual techniques   PT Next Visit Plan Continue passive HS stretch and active SKTC/quad stretch. Progress to body mechanics during lifting exercises.        Patient will benefit from skilled therapeutic intervention in order to improve the following deficits and impairments:  Decreased activity tolerance, Decreased mobility, Decreased range of motion, Difficulty walking, Decreased strength, Impaired flexibility, Increased muscle spasms, Pain, Improper body mechanics, Postural dysfunction  Visit Diagnosis: Midline low back pain without sciatica  Cramp and spasm     Problem List Patient Active Problem List   Diagnosis Date Noted  . Lumbar degenerative disc disease 08/01/2015  . Costochondritis 08/01/2015  . Chronic pelvic pain in female 06/23/2011    Justus Memory, SPTA 08/28/2015, 4:49 PM  [facility] [place] [address] [place], [place], [address] Phone: [phone]   Fax:  [phone]  Name: Erika Gay MRN: [id number] Date of Birth: 24-Apr-1980

## 2015-08-29 ENCOUNTER — Ambulatory Visit: Payer: 59 | Admitting: Physical Therapy

## 2015-09-03 ENCOUNTER — Encounter: Payer: Self-pay | Admitting: Physical Therapy

## 2015-09-03 ENCOUNTER — Ambulatory Visit: Payer: No Typology Code available for payment source | Attending: Sports Medicine | Admitting: Physical Therapy

## 2015-09-03 DIAGNOSIS — M545 Low back pain, unspecified: Secondary | ICD-10-CM

## 2015-09-03 DIAGNOSIS — R252 Cramp and spasm: Secondary | ICD-10-CM | POA: Insufficient documentation

## 2015-09-03 NOTE — Therapy (Signed)
[facility] [place] [address] [place], [place], [address] Phone: [phone]   Fax:  [phone]  Physical Therapy Treatment  Patient Details  Name: Erika Gay MRN: [id number] Date of Birth: 02-12-1980 Referring Provider: Synetta Fail  Encounter Date: 09/03/2015      PT End of Session - 09/03/15 0933    Visit Number 5   Date for PT Re-Evaluation 10/20/15   PT Start Time 0845   PT Stop Time 0948   PT Time Calculation (min) 63 min   Activity Tolerance Patient tolerated treatment well   Behavior During Therapy [facility] for tasks assessed/performed      Past Medical History:  Diagnosis Date  . Pelvic pain     Past Surgical History:  Procedure Laterality Date  . REFRACTIVE SURGERY    . WISDOM TOOTH EXTRACTION      There were no vitals filed for this visit.      Subjective Assessment - 09/03/15 0851    Subjective Pt reports that she had a very busy week but that she was able to do her stretches yesterday. Pt stated that her back is still hurting but not getting worse.    Limitations Standing;Sitting   How long can you sit comfortably? 120 minutes   How long can you stand comfortably? 1 hour   Diagnostic tests MRI   Patient Stated Goals have less pain   Currently in Pain? Yes   Pain Score 6    Pain Location Back   Pain Orientation Lower                         [facility] Adult PT Treatment/Exercise - 09/03/15 0001      Neck Exercises: Machines for Strengthening   Other Machines for Strengthening Lat pull downs for 20# 2x10   Other Machines for Strengthening Chest press 15# 2x10     Lumbar Exercises: Stretches   Passive Hamstring Stretch 3 reps;10 seconds   Single Knee to Chest Stretch 3 reps;10 seconds   Double Knee to Chest Stretch 3 reps;10 seconds   Pelvic Tilt Limitations Pelvic clock on blue disc clockwise/counter clockwise x5 ea   Piriformis Stretch 3 reps;10 seconds     Lumbar  Exercises: Aerobic   Tread Mill nustep Level 4 x 7 minutes     Lumbar Exercises: Standing   Scapular Retraction Strengthening   Theraband Level (Scapular Retraction) Level 4 (Blue)   Shoulder Extension Strengthening   Theraband Level (Shoulder Extension) Level 4 (Blue)     Knee/Hip Exercises: Seated   Ball Squeeze 2x10   Other Seated Knee/Hip Exercises Trunk extension with black tband x10   Abduction/Adduction  Strengthening;10 reps   Abd/Adduction Limitations Hip abd with blue tband x10   Sit to Sand 10 reps;without UE support  With OHP with weighted yellow ball     Moist Heat Therapy   Number Minutes Moist Heat 15 Minutes   Moist Heat Location Lumbar Spine;Shoulder     Electrical Stimulation   Electrical Stimulation Location Lumbar area   Electrical Stimulation Action IFC   Electrical Stimulation Parameters Supine   Electrical Stimulation Goals Pain                  PT Short Term Goals - 09/03/15 0937      PT SHORT TERM GOAL #1   Title independent with initial HEP   Time 2   Period Weeks   Status On-going  PT Long Term Goals - 08/19/15 1525      PT LONG TERM GOAL #1   Title increase Lumbar ROM 25%   Time 8   Period Weeks   Status New     PT LONG TERM GOAL #2   Title decrease pain 50%   Time 8   Period Weeks   Status New     PT LONG TERM GOAL #3   Title understand posture and body mechanics   Time 8   Period Weeks   Status New     PT LONG TERM GOAL #4   Title tolerate standing for work without any issues   Time 8   Period Weeks   Status New               Plan - 09/03/15 0934    Clinical Impression Statement Pt tolerated sit to stand with weighted ball and no increase in pain for standing exercises. Pt tolerated added exercises for strengthening and showed more flexibility during passive HS stretch.    Rehab Potential Good   PT Frequency 2x / week   PT Duration 8 weeks   PT Treatment/Interventions ADLs/Self Care Home  Management;Electrical Stimulation;Cryotherapy;Moist Heat;Therapeutic exercise;Therapeutic activities;Ultrasound;Traction;Neuromuscular re-education;Patient/family education;Manual techniques   PT Next Visit Plan Continue with stretches and provide check on learning for HEP. Continue with squatting exercise but progress to blue weighted ball.       Patient will benefit from skilled therapeutic intervention in order to improve the following deficits and impairments:  Decreased activity tolerance, Decreased mobility, Decreased range of motion, Difficulty walking, Decreased strength, Impaired flexibility, Increased muscle spasms, Pain, Improper body mechanics, Postural dysfunction  Visit Diagnosis: Midline low back pain without sciatica  Cramp and spasm     Problem List Patient Active Problem List   Diagnosis Date Noted  . Lumbar degenerative disc disease 08/01/2015  . Costochondritis 08/01/2015  . Chronic pelvic pain in female 06/23/2011    Justus Memory, SPTA 09/03/2015, 9:38 AM  [facility] [place] [address] [place], [place], [address] Phone: [phone]   Fax:  [phone]  Name: Erika Gay MRN: [id number] Date of Birth: Jun 06, 1980

## 2015-09-05 ENCOUNTER — Ambulatory Visit: Payer: No Typology Code available for payment source | Admitting: Physical Therapy

## 2015-09-09 ENCOUNTER — Ambulatory Visit: Payer: No Typology Code available for payment source | Admitting: Physical Therapy

## 2015-09-10 ENCOUNTER — Ambulatory Visit: Payer: No Typology Code available for payment source | Admitting: Physical Therapy

## 2015-09-11 ENCOUNTER — Ambulatory Visit: Payer: No Typology Code available for payment source | Admitting: Physical Therapy

## 2015-09-11 DIAGNOSIS — R252 Cramp and spasm: Secondary | ICD-10-CM

## 2015-09-11 DIAGNOSIS — M545 Low back pain, unspecified: Secondary | ICD-10-CM

## 2015-09-11 NOTE — Therapy (Signed)
[facility] [address] [place], [place], [address] Phone: [phone]   Fax:  [phone]  Physical Therapy Treatment  Patient Details  Name: Anthonette Legatolviris Aki MRN: [id number] Date of Birth: 11/19/1980 Referring Provider: Synetta Failhekkekandrum  Encounter Date: 09/11/2015      PT End of Session - 09/11/15 0831    Visit Number 6   Date for PT Re-Evaluation 10/20/15   PT Start Time 0805   PT Stop Time 0831   PT Time Calculation (min) 26 min   Activity Tolerance Patient tolerated treatment well   Behavior During Therapy [facility] for tasks assessed/performed      Past Medical History:  Diagnosis Date  . Pelvic pain     Past Surgical History:  Procedure Laterality Date  . REFRACTIVE SURGERY    . WISDOM TOOTH EXTRACTION      There were no vitals filed for this visit.      Subjective Assessment - 09/11/15 0805    Subjective Pt reports that she is still feeling pain but that she has been doing her stretches "every now and then." Pt reports that her session will be limited today, due to having to go to work.    Limitations Standing;Sitting   How long can you sit comfortably? 120 minutes   How long can you stand comfortably? 1 hour   Diagnostic tests MRI   Patient Stated Goals have less pain   Pain Score 5    Pain Location Back   Pain Orientation Lower   Pain Onset More than a month ago                         [facility]/Exercise - 09/11/15 0001      Neck Exercises: Machines for Strengthening   Other Machines for Strengthening Rows 15# and Lat pull downs 20# 2x10   Other Machines for Strengthening Chest press 20# 2x10     Lumbar Exercises: Stretches   Passive Hamstring Stretch 3 reps;10 seconds   Pelvic Tilt Limitations Pelvic clock on blue disc clockwise/counter clockwise x5 ea     Lumbar Exercises: Aerobic   Tread Mill Nustep level 4 x 6 minutes     Lumbar Exercises: Standing   Forward Lunge 5 reps;3 seconds  R/L   Forward Lunge Limitations To 8 in step     Lumbar Exercises: Seated   Sit to Stand 10 reps  No UE support   Sit to Stand Limitations With weighted yellow ball     Lumbar Exercises: Supine   Bridge 10 reps   Bridge Limitations From Green pball   Straight Leg Raise 10 reps   Straight Leg Raises Limitations From green pball                  PT Short Term Goals - 09/03/15 0937      PT SHORT TERM GOAL #1   Title independent with initial HEP   Time 2   Period Weeks   Status On-going           PT Long Term Goals - 08/19/15 1525      PT LONG TERM GOAL #1   Title increase Lumbar ROM 25%   Time 8   Period Weeks   Status New     PT LONG TERM GOAL #2   Title decrease pain 50%   Time 8   Period Weeks   Status New  PT LONG TERM GOAL #3   Title understand posture and body mechanics   Time 8   Period Weeks   Status New     PT LONG TERM GOAL #4   Title tolerate standing for work without any issues   Time 8   Period Weeks   Status New               Plan - 09/11/15 0831    Clinical Impression Statement Pt howed more flexibility during pelvic clock exercise today, movement were less stiff. Pt had difficulty with seated rows at machine, required adjustment to less weight today. Tolerated lunges to 8in step with no increase in pain.    Rehab Potential Good   PT Frequency 2x / week   PT Duration 8 weeks   PT Treatment/Interventions ADLs/Self Care Home Management;Electrical Stimulation;Cryotherapy;Moist Heat;Therapeutic exercise;Therapeutic activities;Ultrasound;Traction;Neuromuscular re-education;Patient/family education;Manual techniques   PT Next Visit Plan Progress to functional squats and working on body mechanics when lifting objects from floor.       Patient will benefit from skilled therapeutic intervention in order to improve the following deficits and impairments:  Decreased activity tolerance, Decreased  mobility, Decreased range of motion, Difficulty walking, Decreased strength, Impaired flexibility, Increased muscle spasms, Pain, Improper body mechanics, Postural dysfunction  Visit Diagnosis: Midline low back pain without sciatica  Cramp and spasm     Problem List Patient Active Problem List   Diagnosis Date Noted  . Lumbar degenerative disc disease 08/01/2015  . Costochondritis 08/01/2015  . Chronic pelvic pain in female 06/23/2011    Justus Memory, SPTA 09/11/2015, 8:36 AM  [facility] [address] [place], [place], [address] Phone: [phone]   Fax:  [phone]  Name: Honestee Revard MRN: [id number] Date of Birth: March 03, 1980

## 2015-09-19 ENCOUNTER — Encounter: Payer: Self-pay | Admitting: Physical Therapy

## 2015-09-20 ENCOUNTER — Ambulatory Visit: Payer: No Typology Code available for payment source | Admitting: Physical Therapy

## 2015-09-24 ENCOUNTER — Encounter: Payer: Self-pay | Admitting: Physical Therapy

## 2015-09-24 ENCOUNTER — Ambulatory Visit: Payer: No Typology Code available for payment source | Admitting: Physical Therapy

## 2015-09-24 DIAGNOSIS — M545 Low back pain, unspecified: Secondary | ICD-10-CM

## 2015-09-24 NOTE — Therapy (Signed)
[place], [place], [address] Phone: [phone]   Fax:  [phone]  Physical Therapy Treatment  Patient Details  Name: Erika Gay MRN: [id number] Date of Birth: Feb 28, 1980 Referring Provider: Reymundo Poll  Encounter Date: 09/24/2015      PT End of Session - 09/24/15 0842    Visit Number 7   Date for PT Re-Evaluation 10/20/15   PT Start Time 0802   PT Stop Time 0842   PT Time Calculation (min) 40 min   Activity Tolerance Patient tolerated treatment well   Behavior During Therapy [facility] for tasks assessed/performed      Past Medical History:  Diagnosis Date  . Pelvic pain     Past Surgical History:  Procedure Laterality Date  . REFRACTIVE SURGERY    . WISDOM TOOTH EXTRACTION      There were no vitals filed for this visit.      Subjective Assessment - 09/24/15 0803    Subjective "Pretty good, about a 4 today in my lower back. I just hurt"   Currently in Pain? Yes   Pain Score 4    Pain Location Back   Pain Orientation Lower            [facility] PT Assessment - 09/24/15 0001      AROM   Overall AROM Comments lumbar ROM WFL, reports pain and tightness with lumbar extension     Strength   Overall Strength Comments 4+/5 for the LE's without pain                     [facility] Adult PT Treatment/Exercise - 09/24/15 0001      Neck Exercises: Machines for Strengthening   Other Machines for Strengthening Rows 20# and Lat pull downs 20# 2x10     Lumbar Exercises: Aerobic   Tread Mill Nustep level 3 x 6 minutes     Lumbar Exercises: Machines for Strengthening   Cybex Knee Extension 10lb 3x10   Cybex Knee Flexion 20lb 3x10   Leg Press 20lb2x15      Lumbar Exercises: Standing   Shoulder Extension Strengthening;15 reps   Theraband Level (Shoulder Extension) Level 3 (Green)   Other Standing Lumbar Exercises Standing straight arm pull downs 25lb 2x15   Other  Standing Lumbar Exercises Standing march 3lb 2x10; Standing hip ext & abd 3lb 2x10                  PT Short Term Goals - 09/03/15 1694      PT SHORT TERM GOAL #1   Title independent with initial HEP   Time 2   Period Weeks   Status On-going           PT Long Term Goals - 09/24/15 0845      PT LONG TERM GOAL #1   Title increase Lumbar ROM 25%   Status Achieved     PT LONG TERM GOAL #2   Title decrease pain 50%   Status On-going     PT LONG TERM GOAL #3   Title understand posture and body mechanics   Status Partially Met     PT LONG TERM GOAL #4   Title tolerate standing for work without any issues   Status Partially Met               Plan - 09/24/15 0842    Clinical Impression Statement Pt has progressed and increase her lumbar  ROM, despite reporting some pain and tightness with extension. Pt tolerated a progression to machine level interventions well but does report some LE fatigue. Requires one seated rest break between weighted hip abduction and extensions due to fatigue and weakness.   Rehab Potential Good   PT Frequency 2x / week   PT Duration 8 weeks   PT Treatment/Interventions ADLs/Self Care Home Management;Electrical Stimulation;Cryotherapy;Moist Heat;Therapeutic exercise;Therapeutic activities;Ultrasound;Traction;Neuromuscular re-education;Patient/family education;Manual techniques   PT Next Visit Plan Progress to functional squats and working on body mechanics when lifting objects from floor.       Patient will benefit from skilled therapeutic intervention in order to improve the following deficits and impairments:  Decreased activity tolerance, Decreased mobility, Decreased range of motion, Difficulty walking, Decreased strength, Impaired flexibility, Increased muscle spasms, Pain, Improper body mechanics, Postural dysfunction  Visit Diagnosis: Midline low back pain without sciatica     Problem List Patient Active Problem List    Diagnosis Date Noted  . Lumbar degenerative disc disease 08/01/2015  . Costochondritis 08/01/2015  . Chronic pelvic pain in female 06/23/2011    Scot Jun, PTA  09/24/2015, 8:45 AM  [place], [place], [address] Phone: [phone]   Fax:  [phone]  Name: Erika Gay MRN: [id number] Date of Birth: October 28, 1980

## 2015-09-25 ENCOUNTER — Ambulatory Visit: Payer: No Typology Code available for payment source | Admitting: Physical Therapy

## 2015-09-26 ENCOUNTER — Ambulatory Visit: Payer: No Typology Code available for payment source | Admitting: Physical Therapy

## 2015-09-26 DIAGNOSIS — R252 Cramp and spasm: Secondary | ICD-10-CM

## 2015-09-26 DIAGNOSIS — M545 Low back pain, unspecified: Secondary | ICD-10-CM

## 2015-09-26 NOTE — Therapy (Signed)
[facility] [address] [place], [place], [address] Phone: [phone]   Fax:  [phone]  Physical Therapy Treatment  Patient Details  Name: Erika Gay MRN: [id number] Date of Birth: 07-01-1980 Referring Provider: Synetta Fail  Encounter Date: 09/26/2015      PT End of Session - 09/26/15 1720    Visit Number 8   Date for PT Re-Evaluation 10/20/15   PT Start Time 1615   PT Stop Time 1700   PT Time Calculation (min) 45 min      Past Medical History:  Diagnosis Date  . Pelvic pain     Past Surgical History:  Procedure Laterality Date  . REFRACTIVE SURGERY    . WISDOM TOOTH EXTRACTION      There were no vitals filed for this visit.      Subjective Assessment - 09/26/15 1617    Subjective always achey-good and bad days.Cant say its better. Need to f/u with MD. Heat helps,stretching helps-varies with what I do during the day.   Currently in Pain? Yes   Pain Score 5    Pain Location Back                         [facility] Adult PT Treatment/Exercise - 09/26/15 0001      Lumbar Exercises: Aerobic   Tread Mill Nustep L 5     Lumbar Exercises: Machines for Strengthening   Cybex Knee Extension 10lb 3x10   Cybex Knee Flexion 20lb 3x10   Leg Press 20lb2x15    Other Lumbar Machine Exercise seated row 20# 2 set s15   Other Lumbar Machine Exercise lat pull 20# 2 sets 15     Moist Heat Therapy   Number Minutes Moist Heat 15 Minutes   Moist Heat Location Lumbar Spine     Manual Therapy   Manual Therapy Passive ROM   Passive ROM LE and trunk                   PT Short Term Goals - 09/26/15 1719      PT SHORT TERM GOAL #1   Title independent with initial HEP   Status Achieved           PT Long Term Goals - 09/26/15 1720      PT LONG TERM GOAL #1   Title increase Lumbar ROM 25%   Status Achieved     PT LONG TERM GOAL #2   Title decrease pain 50%   Status  On-going     PT LONG TERM GOAL #3   Title understand posture and body mechanics   Status On-going     PT LONG TERM GOAL #4   Title tolerate standing for work without any issues   Status On-going               Plan - 09/26/15 1720    Clinical Impression Statement very tight LE and trunk, pt verb partial compliance with stretches at home. Pt tolerated ther ex well but c/o muscle fatigue. Pt unsure if PT is helping.   PT Next Visit Plan Pt to make MD f/u appt,check when and send note      Patient will benefit from skilled therapeutic intervention in order to improve the following deficits and impairments:  Decreased activity tolerance, Decreased mobility, Decreased range of motion, Difficulty walking, Decreased strength, Impaired flexibility, Increased muscle spasms, Pain, Improper body mechanics, Postural dysfunction  Visit Diagnosis: Midline low back pain without sciatica  Cramp and spasm     Problem List Patient Active Problem List   Diagnosis Date Noted  . Lumbar degenerative disc disease 08/01/2015  . Costochondritis 08/01/2015  . Chronic pelvic pain in female 06/23/2011    Mckennon Zwart,ANGIE PTA 09/26/2015, 5:22 PM  [facility] [address] [place], [place], [address] Phone: [phone]   Fax:  [phone]  Name: Erika Gay MRN: [id number] Date of Birth: 08/22/1980

## 2015-10-01 ENCOUNTER — Encounter: Payer: Self-pay | Admitting: Physical Therapy

## 2015-10-01 ENCOUNTER — Ambulatory Visit: Payer: No Typology Code available for payment source | Admitting: Physical Therapy

## 2015-10-01 DIAGNOSIS — M545 Low back pain, unspecified: Secondary | ICD-10-CM

## 2015-10-01 DIAGNOSIS — R252 Cramp and spasm: Secondary | ICD-10-CM

## 2015-10-01 NOTE — Therapy (Addendum)
[place], [place], [address] Phone: [phone]   Fax:  [phone]  Physical Therapy Treatment  Patient Details  Name: Erika Gay MRN: [id number] Date of Birth: Jun 04, 1980 Referring Provider: Reymundo Poll  Encounter Date: 10/01/2015      PT End of Session - 10/01/15 0836    Visit Number 9   Date for PT Re-Evaluation 10/20/15   PT Start Time 0813   PT Stop Time 0900   PT Time Calculation (min) 47 min      Past Medical History:  Diagnosis Date  . Pelvic pain     Past Surgical History:  Procedure Laterality Date  . REFRACTIVE SURGERY    . WISDOM TOOTH EXTRACTION      There were no vitals filed for this visit.      Subjective Assessment - 10/01/15 0813    Subjective stretching at home which helps, same as last session. Working on MD appt. Pt 13 min late   Currently in Pain? Yes   Pain Score 5    Pain Location Back   Pain Orientation Lower                         [facility] Adult PT Treatment/Exercise - 10/01/15 0001      Lumbar Exercises: Stretches   Active Hamstring Stretch 3 reps;10 seconds   Passive Hamstring Stretch 3 reps;10 seconds   Single Knee to Chest Stretch 3 reps;10 seconds   Lower Trunk Rotation 2 reps;10 seconds     Lumbar Exercises: Aerobic   Tread Mill Nustep L 5 3mn     Lumbar Exercises: Machines for Strengthening   Cybex Lumbar Extension blue tband 2 sets 10   Cybex Knee Extension 10lb 3x10   Cybex Knee Flexion 20lb 3x10   Other Lumbar Machine Exercise seated row 20# 2 set s15   Other Lumbar Machine Exercise lat pull 20# 2 sets 15     Lumbar Exercises: Supine   Bridge 15 reps;3 seconds  with ball   Straight Leg Raise 10 reps  with abd   Large Ball Abdominal Isometric 15 reps;3 seconds     Lumbar Exercises: Quadruped   Opposite Arm/Leg Raise Right arm/Left leg;Left arm/Right leg;10 reps;3 seconds  2 sets     Moist Heat  Therapy   Number Minutes Moist Heat 15 Minutes   Moist Heat Location Lumbar Spine     Electrical Stimulation   Electrical Stimulation Location Lumbar area   Electrical Stimulation Action IFC   Electrical Stimulation Parameters supine   Electrical Stimulation Goals Pain                  PT Short Term Goals - 09/26/15 1719      PT SHORT TERM GOAL #1   Title independent with initial HEP   Status Achieved           PT Long Term Goals - 09/26/15 1720      PT LONG TERM GOAL #1   Title increase Lumbar ROM 25%   Status Achieved     PT LONG TERM GOAL #2   Title decrease pain 50%   Status On-going     PT LONG TERM GOAL #3   Title understand posture and body mechanics   Status On-going     PT LONG TERM GOAL #4   Title tolerate standing for work without any issues   Status On-going  PHYSICAL THERAPY DISCHARGE SUMMARY   Plan: Patient agrees to discharge.  Patient goals were not met. Patient is being discharged due to not returning since the last visit.  ?????               Plan - 10/01/15 0836    Clinical Impression Statement LE tightnes Rt greater than Left. LBP pain with LE stretches-continue to encourage stretching compliance at home. Tolerated all ther ex well.   PT Next Visit Plan Pt to make MD f/u appt,check when and send note. ROM and strengthening      Patient will benefit from skilled therapeutic intervention in order to improve the following deficits and impairments:  Decreased activity tolerance, Decreased mobility, Decreased range of motion, Difficulty walking, Decreased strength, Impaired flexibility, Increased muscle spasms, Pain, Improper body mechanics, Postural dysfunction  Visit Diagnosis: Midline low back pain without sciatica  Cramp and spasm     Problem List Patient Active Problem List   Diagnosis Date Noted  . Lumbar degenerative disc disease 08/01/2015  . Costochondritis 08/01/2015  . Chronic pelvic pain in female  06/23/2011    Zelena Bushong,ANGIE PTA 10/01/2015, 8:40 AM  [place], [place], [address] Phone: [phone]   Fax:  [phone]  Name: Erika Gay MRN: [id number] Date of Birth: [birth year]

## 2015-10-04 ENCOUNTER — Ambulatory Visit (INDEPENDENT_AMBULATORY_CARE_PROVIDER_SITE_OTHER): Payer: 59 | Admitting: Sports Medicine

## 2015-10-04 ENCOUNTER — Encounter: Payer: Self-pay | Admitting: Sports Medicine

## 2015-10-04 DIAGNOSIS — M5136 Other intervertebral disc degeneration, lumbar region: Secondary | ICD-10-CM

## 2015-10-04 DIAGNOSIS — E669 Obesity, unspecified: Secondary | ICD-10-CM | POA: Diagnosis not present

## 2015-10-04 DIAGNOSIS — M51369 Other intervertebral disc degeneration, lumbar region without mention of lumbar back pain or lower extremity pain: Secondary | ICD-10-CM

## 2015-10-04 MED ORDER — DICLOFENAC SODIUM 75 MG PO TBEC: 75.0000 mg | DELAYED_RELEASE_TABLET | Freq: Two times a day (BID) | ORAL | 3 refills | Status: DC

## 2015-10-04 MED ORDER — PHENTERMINE HCL 37.5 MG PO TABS: ORAL_TABLET | ORAL | 0 refills | Status: DC

## 2015-10-04 NOTE — Assessment & Plan Note (Signed)
Starting phentermine, return in one month for weight check.

## 2015-10-04 NOTE — Assessment & Plan Note (Signed)
L4-L5 and L5-S1 degenerative disc disease, this does appear somewhat chronic. Switching to diclofenac, she will let me know whether she would like to proceed with gabapentin, or epidurals, she has had no epidural the past that did provide some relief. Advised that we would not be providing any disability ratings.

## 2015-10-04 NOTE — Progress Notes (Signed)
  Subjective:    CC: Follow-up  HPI: Degenerative disc disease: Has been through multiple sessions of physical therapy which helped only a little bit, has already seen a chiropractor multiple times. Did have an epidural in the past at an outside facility that provided about 2 months of relief. She does have contact with an attorney and is seemingly looking for a disability rating. We have not yet tried neuropathic agents, and she has only had a single epidural. Pain continues to be axial without much of her radicular component, discogenic, worse with sitting, flexion, Valsalva.  Obesity: Agrees that this may be contributing some of her back pain, would like to start weight loss medication.  Past medical history, Surgical history, Family history not pertinant except as noted below, Social history, Allergies, and medications have been entered into the medical record, reviewed, and no changes needed.   Review of Systems: No fevers, chills, night sweats, weight loss, chest pain, or shortness of breath.   Objective:    General: Well Developed, well nourished, and in no acute distress.  Neuro: Alert and oriented x3, extra-ocular muscles intact, sensation grossly intact.  HEENT: Normocephalic, atraumatic, pupils equal round reactive to light, neck supple, no masses, no lymphadenopathy, thyroid nonpalpable.  Skin: Warm and dry, no rashes. Cardiac: Regular rate and rhythm, no murmurs rubs or gallops, no lower extremity edema.  Respiratory: Clear to auscultation bilaterally. Not using accessory muscles, speaking in full sentences.  Impression and Recommendations:    Lumbar degenerative disc disease L4-L5 and L5-S1 degenerative disc disease, this does appear somewhat chronic. Switching to diclofenac, she will let me know whether she would like to proceed with gabapentin, or epidurals, she has had no epidural the past that did provide some relief. Advised that we would not be providing any disability  ratings.  Obesity Starting phentermine, return in one month for weight check.  I spent 25 minutes with this patient, greater than 50% was face-to-face time counseling regarding the above diagnoses

## 2015-10-04 NOTE — Patient Instructions (Signed)
Options for treatment include gabapentin, Lyrica, and further epidurals. Switch from meloxicam to diclofenac.

## 2015-10-08 ENCOUNTER — Encounter: Payer: Self-pay | Admitting: Physician Assistant

## 2015-10-08 ENCOUNTER — Ambulatory Visit (INDEPENDENT_AMBULATORY_CARE_PROVIDER_SITE_OTHER): Payer: 59

## 2015-10-08 ENCOUNTER — Ambulatory Visit (INDEPENDENT_AMBULATORY_CARE_PROVIDER_SITE_OTHER): Payer: 59 | Admitting: Physician Assistant

## 2015-10-08 VITALS — BP 123/64 | HR 67 | Ht 66.5 in | Wt 200.0 lb

## 2015-10-08 DIAGNOSIS — E669 Obesity, unspecified: Secondary | ICD-10-CM

## 2015-10-08 DIAGNOSIS — R0781 Pleurodynia: Secondary | ICD-10-CM

## 2015-10-08 DIAGNOSIS — Z1231 Encounter for screening mammogram for malignant neoplasm of breast: Secondary | ICD-10-CM

## 2015-10-08 DIAGNOSIS — Z111 Encounter for screening for respiratory tuberculosis: Secondary | ICD-10-CM

## 2015-10-08 DIAGNOSIS — Z23 Encounter for immunization: Secondary | ICD-10-CM | POA: Diagnosis not present

## 2015-10-08 DIAGNOSIS — R5383 Other fatigue: Secondary | ICD-10-CM

## 2015-10-08 DIAGNOSIS — Z13220 Encounter for screening for lipoid disorders: Secondary | ICD-10-CM

## 2015-10-08 DIAGNOSIS — G8929 Other chronic pain: Secondary | ICD-10-CM

## 2015-10-08 DIAGNOSIS — L659 Nonscarring hair loss, unspecified: Secondary | ICD-10-CM

## 2015-10-08 LAB — CBC
HCT: 38.3 % (ref 35.0–45.0)
HEMOGLOBIN: 12.6 g/dL (ref 11.7–15.5)
MCH: 27 pg (ref 27.0–33.0)
MCHC: 32.9 g/dL (ref 32.0–36.0)
MCV: 82.2 fL (ref 80.0–100.0)
MPV: 10.7 fL (ref 7.5–12.5)
Platelets: 310 10*3/uL (ref 140–400)
RBC: 4.66 MIL/uL (ref 3.80–5.10)
RDW: 14.3 % (ref 11.0–15.0)
WBC: 8.8 10*3/uL (ref 3.8–10.8)

## 2015-10-08 LAB — COMPREHENSIVE METABOLIC PANEL
ALBUMIN: 4.1 g/dL (ref 3.6–5.1)
ALT: 20 U/L (ref 6–29)
AST: 17 U/L (ref 10–30)
Alkaline Phosphatase: 46 U/L (ref 33–115)
BILIRUBIN TOTAL: 0.3 mg/dL (ref 0.2–1.2)
BUN: 14 mg/dL (ref 7–25)
CO2: 23 mmol/L (ref 20–31)
CREATININE: 0.88 mg/dL (ref 0.50–1.10)
Calcium: 9.2 mg/dL (ref 8.6–10.2)
Chloride: 105 mmol/L (ref 98–110)
Glucose, Bld: 80 mg/dL (ref 65–99)
Potassium: 4.2 mmol/L (ref 3.5–5.3)
SODIUM: 138 mmol/L (ref 135–146)
TOTAL PROTEIN: 7.4 g/dL (ref 6.1–8.1)

## 2015-10-08 LAB — LIPID PANEL
CHOL/HDL RATIO: 2.5 ratio (ref <=5.0)
Cholesterol: 138 mg/dL (ref 125–200)
HDL: 56 mg/dL (ref >=46)
LDL CALC: 50 mg/dL (ref <130)
Triglycerides: 158 mg/dL — ABNORMAL HIGH (ref <150)
VLDL: 32 mg/dL — AB (ref <30)

## 2015-10-08 NOTE — Patient Instructions (Signed)
Fatigue  Fatigue is feeling tired all of the time, a lack of energy, or a lack of motivation. Occasional or mild fatigue is often a normal response to activity or life in general. However, long-lasting (chronic) or extreme fatigue may indicate an underlying medical condition.  HOME CARE INSTRUCTIONS   Watch your fatigue for any changes. The following actions may help to lessen any discomfort you are feeling:  · Talk to your health care provider about how much sleep you need each night. Try to get the required amount every night.  · Take medicines only as directed by your health care provider.  · Eat a healthy and nutritious diet. Ask your health care provider if you need help changing your diet.  · Drink enough fluid to keep your urine clear or pale yellow.  · Practice ways of relaxing, such as yoga, meditation, massage therapy, or acupuncture.  · Exercise regularly.    · Change situations that cause you stress. Try to keep your work and personal routine reasonable.  · Do not abuse illegal drugs.  · Limit alcohol intake to no more than 1 drink per day for nonpregnant women and 2 drinks per day for men. One drink equals 12 ounces of beer, 5 ounces of wine, or 1½ ounces of hard liquor.  · Take a multivitamin, if directed by your health care provider.  SEEK MEDICAL CARE IF:   · Your fatigue does not get better.  · You have a fever.    · You have unintentional weight loss or gain.  · You have headaches.    · You have difficulty:      Falling asleep.    Sleeping throughout the night.  · You feel angry, guilty, anxious, or sad.     · You are unable to have a bowel movement (constipation).    · You skin is dry.     · Your legs or another part of your body is swollen.    SEEK IMMEDIATE MEDICAL CARE IF:   · You feel confused.    · Your vision is blurry.  · You feel faint or pass out.    · You have a severe headache.    · You have severe abdominal, pelvic, or back pain.    · You have chest pain, shortness of breath, or an  irregular or fast heartbeat.    · You are unable to urinate or you urinate less than normal.    · You develop abnormal bleeding, such as bleeding from the rectum, vagina, nose, lungs, or nipples.  · You vomit blood.     · You have thoughts about harming yourself or committing suicide.    · You are worried that you might harm someone else.       This information is not intended to replace advice given to you by your health care provider. Make sure you discuss any questions you have with your health care provider.     Document Released: 11/16/2006 Document Revised: 02/09/2014 Document Reviewed: 05/23/2013  [employer]vier Interactive Patient Education ©2016 [employer]

## 2015-10-09 LAB — ANA: Anti Nuclear Antibody(ANA): NEGATIVE

## 2015-10-09 LAB — VITAMIN D 25 HYDROXY (VIT D DEFICIENCY, FRACTURES): Vit D, 25-Hydroxy: 37 ng/mL (ref 30–100)

## 2015-10-09 LAB — VITAMIN B12: Vitamin B-12: 611 pg/mL (ref 200–1100)

## 2015-10-09 LAB — TSH: TSH: 1.44 m[IU]/L

## 2015-10-09 LAB — FERRITIN: FERRITIN: 47 ng/mL (ref 10–154)
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
## 2015-10-10 ENCOUNTER — Other Ambulatory Visit: Payer: Self-pay

## 2015-10-10 DIAGNOSIS — R0781 Pleurodynia: Secondary | ICD-10-CM

## 2015-10-10 NOTE — Progress Notes (Signed)
CT order changed per Radiology and Dr. Benjamin Stainhekkekandam recommendation.

## 2015-10-11 LAB — QUANTIFERON TB GOLD ASSAY (BLOOD)
INTERFERON GAMMA RELEASE ASSAY: NEGATIVE
Mitogen-Nil: 9.72 IU/mL
QUANTIFERON NIL VALUE: 0.1 [IU]/mL
Quantiferon Tb Ag Minus Nil Value: 0.01 IU/mL

## 2015-10-14 ENCOUNTER — Telehealth: Payer: Self-pay | Admitting: Physician Assistant

## 2015-10-14 NOTE — Telephone Encounter (Signed)
Ms. Erika Gay needs proof of her negative TB test for her employer. If you could get that printed for her, I'll call and let her know that it is up here. Thanks a bunch!

## 2015-10-14 NOTE — Telephone Encounter (Signed)
Printed for patient

## 2015-10-15 ENCOUNTER — Encounter: Payer: Self-pay | Admitting: Physician Assistant

## 2015-10-15 ENCOUNTER — Ambulatory Visit (INDEPENDENT_AMBULATORY_CARE_PROVIDER_SITE_OTHER): Payer: 59

## 2015-10-15 DIAGNOSIS — K8020 Calculus of gallbladder without cholecystitis without obstruction: Secondary | ICD-10-CM | POA: Insufficient documentation

## 2015-10-15 DIAGNOSIS — R0781 Pleurodynia: Secondary | ICD-10-CM

## 2015-10-15 HISTORY — DX: Calculus of gallbladder without cholecystitis without obstruction: K80.20

## 2015-10-28 ENCOUNTER — Telehealth: Payer: Self-pay

## 2015-10-28 DIAGNOSIS — M51369 Other intervertebral disc degeneration, lumbar region without mention of lumbar back pain or lower extremity pain: Secondary | ICD-10-CM

## 2015-10-28 DIAGNOSIS — M5136 Other intervertebral disc degeneration, lumbar region: Secondary | ICD-10-CM

## 2015-10-28 NOTE — Telephone Encounter (Signed)
Pt left VM stating she's still having severe pain and would like to know if the epidural is still an option at this time. Please advise.

## 2015-10-29 NOTE — Telephone Encounter (Signed)
Erika Gay reports severe low back pain for a few days. She was wanting to know if she could get an epidural injection. Or what ever Dr Benjamin Stainhekkekandam recommends for her back pain.

## 2015-10-29 NOTE — Telephone Encounter (Signed)
Okay to proceed with left L5-S1 interlaminar epidural, orders placed, [facility].

## 2015-10-29 NOTE — Telephone Encounter (Signed)
[facility] message with [facility]. [phone]

## 2015-11-01 ENCOUNTER — Encounter: Payer: Self-pay | Admitting: Sports Medicine

## 2015-11-05 NOTE — Telephone Encounter (Signed)
Patient has been scheduled

## 2015-11-07 ENCOUNTER — Ambulatory Visit
Admission: RE | Admit: 2015-11-07 | Discharge: 2015-11-07 | Disposition: A | Discharge status: Home / Self Care | Payer: 59 | Source: Ambulatory Visit | Attending: Sports Medicine | Admitting: Sports Medicine

## 2015-11-07 VITALS — BP 126/77 | HR 72

## 2015-11-07 DIAGNOSIS — M5136 Other intervertebral disc degeneration, lumbar region: Secondary | ICD-10-CM

## 2015-11-07 MED ORDER — METHYLPREDNISOLONE ACETATE 40 MG/ML INJ SUSP (RADIOLOG
120.0000 mg | Freq: Once | INTRAMUSCULAR | Status: AC
  Administered 2015-11-07: 120 mg via EPIDURAL

## 2015-11-07 MED ORDER — IOPAMIDOL (ISOVUE-M 200) INJECTION 41%
1.0000 mL | Freq: Once | INTRAMUSCULAR | Status: AC
  Administered 2015-11-07: 1 mL via EPIDURAL

## 2015-11-07 NOTE — Discharge Instructions (Signed)

## 2015-11-08 ENCOUNTER — Ambulatory Visit: Payer: 59 | Admitting: Sports Medicine

## 2015-11-13 ENCOUNTER — Telehealth: Payer: Self-pay | Admitting: Sports Medicine

## 2015-11-13 NOTE — Telephone Encounter (Signed)
Pt advised of recommendation, verbalized understanding.  

## 2015-11-13 NOTE — Telephone Encounter (Signed)
Sounds more like a panic attack. Restart medication at one half tablet for a week and then go to 1 full tab. It's unlikely reaction to the phentermine considering how long she's been taking it.

## 2015-11-13 NOTE — Telephone Encounter (Signed)
Pt called clinic today stating she has been taking the Phentermine for almost a month, and feels like she is having a reaction. Pt reports some numbness/tingling in her face, dizziness, and some lightheadedness. Advised Pt to stop taking Rx and come in for an appt. Pt states she "has to work and can't come in." Informed if she can't come in for eval, then to go to UC. Verbalized understanding.

## 2015-11-22 ENCOUNTER — Ambulatory Visit (INDEPENDENT_AMBULATORY_CARE_PROVIDER_SITE_OTHER): Payer: 59 | Admitting: Sports Medicine

## 2015-11-22 DIAGNOSIS — M5136 Other intervertebral disc degeneration, lumbar region: Secondary | ICD-10-CM | POA: Diagnosis not present

## 2015-11-22 DIAGNOSIS — E669 Obesity, unspecified: Secondary | ICD-10-CM | POA: Diagnosis not present

## 2015-11-22 DIAGNOSIS — M51369 Other intervertebral disc degeneration, lumbar region without mention of lumbar back pain or lower extremity pain: Secondary | ICD-10-CM

## 2015-11-22 MED ORDER — IBUPROFEN 800 MG PO TABS: 800.0000 mg | ORAL_TABLET | Freq: Three times a day (TID) | ORAL | 2 refills | Status: DC | PRN

## 2015-11-22 MED ORDER — PHENTERMINE HCL 37.5 MG PO TABS: ORAL_TABLET | ORAL | 0 refills | Status: DC

## 2015-11-22 NOTE — Assessment & Plan Note (Signed)
9 pound weight loss after one month. Refilling medication as we enter the second month. She does have a cruise coming up, I have advised her to continue phentermine and avoid gluttony on the cruise.

## 2015-11-22 NOTE — Assessment & Plan Note (Signed)
Only minimal improvement after epidural. Adding ibuprofen 800 mg for use up to 3 times per day.

## 2015-11-22 NOTE — Progress Notes (Signed)
  Subjective:    CC: Follow-up  HPI: Obesity: 9 pound weight loss.  Lumbar degenerative disc disease: Minimal response to lumbar epidural  Past medical history:  Negative.  See flowsheet/record as well for more information.  Surgical history: Negative.  See flowsheet/record as well for more information.  Family history: Negative.  See flowsheet/record as well for more information.  Social history: Negative.  See flowsheet/record as well for more information.  Allergies, and medications have been entered into the medical record, reviewed, and no changes needed.   Review of Systems: No fevers, chills, night sweats, weight loss, chest pain, or shortness of breath.   Objective:    General: Well Developed, well nourished, and in no acute distress.  Neuro: Alert and oriented x3, extra-ocular muscles intact, sensation grossly intact.  HEENT: Normocephalic, atraumatic, pupils equal round reactive to light, neck supple, no masses, no lymphadenopathy, thyroid nonpalpable.  Skin: Warm and dry, no rashes. Cardiac: Regular rate and rhythm, no murmurs rubs or gallops, no lower extremity edema.  Respiratory: Clear to auscultation bilaterally. Not using accessory muscles, speaking in full sentences.  Impression and Recommendations:    Obesity 9 pound weight loss after one month. Refilling medication as we enter the second month. She does have a cruise coming up, I have advised her to continue phentermine and avoid gluttony on the cruise.  Lumbar degenerative disc disease Only minimal improvement after epidural. Adding ibuprofen 800 mg for use up to 3 times per day.  I spent 40 minutes with this patient, greater than 50% was face-to-face time counseling regarding the above diagnoses

## 2015-11-25 ENCOUNTER — Encounter: Payer: Self-pay | Admitting: Sports Medicine

## 2015-11-25 DIAGNOSIS — M5136 Other intervertebral disc degeneration, lumbar region: Secondary | ICD-10-CM

## 2015-11-25 DIAGNOSIS — M51369 Other intervertebral disc degeneration, lumbar region without mention of lumbar back pain or lower extremity pain: Secondary | ICD-10-CM

## 2015-11-25 MED ORDER — IBUPROFEN 600 MG PO TABS: 600.0000 mg | ORAL_TABLET | Freq: Three times a day (TID) | ORAL | 3 refills | Status: DC | PRN

## 2015-11-26 ENCOUNTER — Ambulatory Visit (INDEPENDENT_AMBULATORY_CARE_PROVIDER_SITE_OTHER): Payer: 59 | Admitting: Sports Medicine

## 2015-11-26 ENCOUNTER — Encounter: Payer: Self-pay | Admitting: Sports Medicine

## 2015-11-26 DIAGNOSIS — M5136 Other intervertebral disc degeneration, lumbar region: Secondary | ICD-10-CM

## 2015-11-26 DIAGNOSIS — M51369 Other intervertebral disc degeneration, lumbar region without mention of lumbar back pain or lower extremity pain: Secondary | ICD-10-CM

## 2015-11-26 NOTE — Assessment & Plan Note (Signed)
Continue ibuprofen. Right SI joint injection today. Return as needed.

## 2015-11-26 NOTE — Progress Notes (Signed)
  Subjective:    CC: Back pain  HPI: This is a pleasant 35 year old female, she is having a flare in her back pain, severe, persistent and localized over the right SI joint. Epidural was only minimally effective, she is here desiring right SI joint injection. Pain radiates to the bottom but not past the knee.  Past medical history:  Negative.  See flowsheet/record as well for more information.  Surgical history: Negative.  See flowsheet/record as well for more information.  Family history: Negative.  See flowsheet/record as well for more information.  Social history: Negative.  See flowsheet/record as well for more information.  Allergies, and medications have been entered into the medical record, reviewed, and no changes needed.   Review of Systems: No fevers, chills, night sweats, weight loss, chest pain, or shortness of breath.   Objective:    General: Well Developed, well nourished, and in no acute distress.  Neuro: Alert and oriented x3, extra-ocular muscles intact, sensation grossly intact.  HEENT: Normocephalic, atraumatic, pupils equal round reactive to light, neck supple, no masses, no lymphadenopathy, thyroid nonpalpable.  Skin: Warm and dry, no rashes. Cardiac: Regular rate and rhythm, no murmurs rubs or gallops, no lower extremity edema.  Respiratory: Clear to auscultation bilaterally. Not using accessory muscles, speaking in full sentences. Back Exam:  Inspection: Unremarkable  Motion: Flexion 45 deg, Extension 45 deg, Side Bending to 45 deg bilaterally,  Rotation to 45 deg bilaterally  SLR laying: Negative  XSLR laying: Negative  Palpable tenderness: Right SI joint. FABER: negative. Sensory change: Gross sensation intact to all lumbar and sacral dermatomes.  Reflexes: 2+ at both patellar tendons, 2+ at achilles tendons, Babinski's downgoing.  Strength at foot  Plantar-flexion: 5/5 Dorsi-flexion: 5/5 Eversion: 5/5 Inversion: 5/5  Leg strength  Quad: 5/5 Hamstring: 5/5  Hip flexor: 5/5 Hip abductors: 5/5  Gait unremarkable.  Procedure: Real-time Ultrasound Guided Injection of right SI joint Device: GE Logiq E  Verbal informed consent obtained.  Time-out conducted.  Noted no overlying erythema, induration, or other signs of local infection.  Skin prepped in a sterile fashion.  Local anesthesia: Topical Ethyl chloride.  With sterile technique and under real time ultrasound guidance:  Taking care to avoid the S1 foramen I dropped 22-gauge spinal needle into the SI joint and 1 mL Kenalog 40, 2 mL lidocaine, 2 mL Marcaine injected easily. Completed without difficulty  Pain immediately resolved suggesting accurate placement of the medication.  Advised to call if fevers/chills, erythema, induration, drainage, or persistent bleeding.  Images permanently stored and available for review in the ultrasound unit.  Impression: Technically successful ultrasound guided injection.  Impression and Recommendations:    Lumbar degenerative disc disease Continue ibuprofen. Right SI joint injection today. Return as needed.

## 2015-11-29 ENCOUNTER — Ambulatory Visit: Payer: 59 | Admitting: Sports Medicine

## 2015-12-23 ENCOUNTER — Encounter: Payer: Self-pay | Admitting: Sports Medicine

## 2015-12-24 ENCOUNTER — Ambulatory Visit: Payer: 59 | Admitting: Sports Medicine

## 2016-01-02 ENCOUNTER — Ambulatory Visit (INDEPENDENT_AMBULATORY_CARE_PROVIDER_SITE_OTHER): Payer: 59 | Admitting: Sports Medicine

## 2016-01-02 ENCOUNTER — Encounter: Payer: Self-pay | Admitting: Sports Medicine

## 2016-01-02 DIAGNOSIS — E669 Obesity, unspecified: Secondary | ICD-10-CM

## 2016-01-02 DIAGNOSIS — M51369 Other intervertebral disc degeneration, lumbar region without mention of lumbar back pain or lower extremity pain: Secondary | ICD-10-CM

## 2016-01-02 DIAGNOSIS — M5136 Other intervertebral disc degeneration, lumbar region: Secondary | ICD-10-CM

## 2016-01-02 MED ORDER — PHENTERMINE HCL 37.5 MG PO TABS: ORAL_TABLET | ORAL | 0 refills | Status: DC

## 2016-01-02 NOTE — Progress Notes (Signed)
  Subjective:    CC: Followup  HPI: This is a 35 year old female, she has had chronic low back pain, multifactorial, she did not respond to a lumbar epidural, and responded temporarily to a SI joint injection.  Unfortunately her pain has returned fully.  Localized, without radiation.  Moderate, persistent.  Obesity, good weight loss on phentermine.  Past medical history:  Negative.  See flowsheet/record as well for more information.  Surgical history: Negative.  See flowsheet/record as well for more information.  Family history: Negative.  See flowsheet/record as well for more information.  Social history: Negative.  See flowsheet/record as well for more information.  Allergies, and medications have been entered into the medical record, reviewed, and no changes needed.   Review of Systems: No fevers, chills, night sweats, weight loss, chest pain, or shortness of breath.   Objective:    General: Well Developed, well nourished, and in no acute distress.  Neuro: Alert and oriented x3, extra-ocular muscles intact, sensation grossly intact.  HEENT: Normocephalic, atraumatic, pupils equal round reactive to light, neck supple, no masses, no lymphadenopathy, thyroid nonpalpable.  Skin: Warm and dry, no rashes. Cardiac: Regular rate and rhythm, no murmurs rubs or gallops, no lower extremity edema.  Respiratory: Clear to auscultation bilaterally. Not using accessory muscles, speaking in full sentences.  Impression and Recommendations:    Lumbar degenerative disc disease Essentially no response to epidural, only a temporary but good response to right SI joint injection with 2 hours of relief. At this point I'm going to refer her to Dr. Cherylann BanasMeloy for consideration of SI radiofrequency ablation.  Obesity Good continued weight loss, entering the third month, refilling phentermine

## 2016-01-02 NOTE — Assessment & Plan Note (Signed)
Essentially no response to epidural, only a temporary but good response to right SI joint injection with 2 hours of relief. At this point I'm going to refer her to Dr. Cherylann BanasMeloy for consideration of SI radiofrequency ablation.

## 2016-01-02 NOTE — Assessment & Plan Note (Signed)
Good continued weight loss, entering the third month, refilling phentermine

## 2016-01-03 ENCOUNTER — Encounter: Payer: Self-pay | Admitting: Sports Medicine

## 2016-01-03 ENCOUNTER — Other Ambulatory Visit: Payer: Self-pay | Admitting: Sports Medicine

## 2016-01-03 DIAGNOSIS — E669 Obesity, unspecified: Secondary | ICD-10-CM
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
## 2016-01-06 ENCOUNTER — Encounter: Payer: Self-pay | Admitting: Physician Assistant

## 2016-01-06 NOTE — Telephone Encounter (Signed)
Was this meant for [facility]?

## 2016-01-08 ENCOUNTER — Encounter: Payer: Self-pay | Admitting: Sports Medicine

## 2016-01-30 ENCOUNTER — Ambulatory Visit: Payer: 59 | Admitting: Sports Medicine

## 2016-02-05 ENCOUNTER — Encounter: Payer: Self-pay | Admitting: Sports Medicine

## 2016-02-05 ENCOUNTER — Encounter: Payer: Self-pay | Admitting: Physician Assistant

## 2016-02-06 ENCOUNTER — Ambulatory Visit: Payer: Self-pay | Admitting: Sports Medicine

## 2016-02-10 ENCOUNTER — Ambulatory Visit: Payer: Self-pay | Admitting: Sports Medicine

## 2016-02-12 ENCOUNTER — Telehealth: Payer: Self-pay | Admitting: *Deleted

## 2016-02-12 NOTE — Telephone Encounter (Signed)
Patient called and states she started vomiting last pm and has not stopped. Patient denies abdominal pain or tenderness, just cramping and soreness from vomiting. Patient denies fever. Patient states she ate [employer] last pm for the first time in a very long time and this is when she started vomiting. Patient is not having any diarrhea and even denies nausea. Advised that it could be viral or what she ate but hard to tell over the phone. We do not have appointments at this time but advised to go to urgent care if felt she may need fluids and evaluation as she has been vomiting since last pm. Patient states she was trying to avoid copay. Advised the most important thing to do at this point is try to stay hydrated by taking small sips of of clear fluids (can try pedialyte,gatorade, sprite, ginger ale, ice chips). Advised if continued to vomit and/or new symptoms started such as fever, severe abdominal pain, diarrhea ect then o go to urgent care.Advised it may be best she go ahead and go to get fluids if need be and eval since vomiting has not let up.  Routing to provider for review

## 2016-02-13 ENCOUNTER — Ambulatory Visit: Payer: Self-pay | Admitting: Sports Medicine

## 2016-02-13 NOTE — Telephone Encounter (Signed)
Return to clinic if not better.

## 2016-02-14 NOTE — Telephone Encounter (Signed)
Pt aware.

## 2016-02-20 ENCOUNTER — Ambulatory Visit: Payer: Self-pay | Admitting: Sports Medicine

## 2016-02-27 ENCOUNTER — Ambulatory Visit (INDEPENDENT_AMBULATORY_CARE_PROVIDER_SITE_OTHER): Payer: 59 | Admitting: Sports Medicine

## 2016-02-27 ENCOUNTER — Encounter: Payer: Self-pay | Admitting: Sports Medicine

## 2016-02-27 DIAGNOSIS — E6609 Other obesity due to excess calories: Secondary | ICD-10-CM

## 2016-02-27 DIAGNOSIS — M5136 Other intervertebral disc degeneration, lumbar region: Secondary | ICD-10-CM

## 2016-02-27 DIAGNOSIS — E669 Obesity, unspecified: Secondary | ICD-10-CM

## 2016-02-27 DIAGNOSIS — M51369 Other intervertebral disc degeneration, lumbar region without mention of lumbar back pain or lower extremity pain: Secondary | ICD-10-CM

## 2016-02-27 MED ORDER — PHENTERMINE HCL 37.5 MG PO TABS: ORAL_TABLET | ORAL | 0 refills | Status: DC

## 2016-02-27 NOTE — Assessment & Plan Note (Signed)
Entering the fourth month, refilling phentermine, further refills will be with her PCP

## 2016-02-27 NOTE — Progress Notes (Signed)
  Subjective:    CC: Follow-up  HPI: This is a pleasant 36 year old female with multifactorial low back pain, epidurals really haven't helped her much though she does have some lumbar degenerative disc disease, her pain has typically been centered around the right SI joint. We have done a few SI joint injections, they tend to work fairly well for a short period of time. I did ask her to discuss RFA with Dr. Cherylann BanasMeloy.  Obesity: Good continued weight loss with phentermine, needs a refill, understands further phentermine refills need to be with her PCP.  Past medical history:  Negative.  See flowsheet/record as well for more information.  Surgical history: Negative.  See flowsheet/record as well for more information.  Family history: Negative.  See flowsheet/record as well for more information.  Social history: Negative.  See flowsheet/record as well for more information.  Allergies, and medications have been entered into the medical record, reviewed, and no changes needed.   Review of Systems: No fevers, chills, night sweats, weight loss, chest pain, or shortness of breath.   Objective:    General: Well Developed, well nourished, and in no acute distress.  Neuro: Alert and oriented x3, extra-ocular muscles intact, sensation grossly intact.  HEENT: Normocephalic, atraumatic, pupils equal round reactive to light, neck supple, no masses, no lymphadenopathy, thyroid nonpalpable.  Skin: Warm and dry, no rashes. Cardiac: Regular rate and rhythm, no murmurs rubs or gallops, no lower extremity edema.  Respiratory: Clear to auscultation bilaterally. Not using accessory muscles, speaking in full sentences.  Procedure: Real-time Ultrasound Guided Injection of right SI joint Device: GE Logiq E  Verbal informed consent obtained.  Time-out conducted.  Noted no overlying erythema, induration, or other signs of local infection.  Skin prepped in a sterile fashion.  Local anesthesia: Topical Ethyl chloride.   With sterile technique and under real time ultrasound guidance:  Today's injection was for diagnostic and therapeutic purposes, I advanced a 22-gauge spinal needle into the sacroiliac joint just medial to the posterior superior iliac spine, I then injected 3 mL lidocaine, 3 mL Marcaine, she experienced concordant pain during the procedure and good improvement afterwards. Completed without difficulty  Pain immediately resolved suggesting accurate placement of the medication.  Advised to call if fevers/chills, erythema, induration, drainage, or persistent bleeding.  Images permanently stored and available for review in the ultrasound unit.  Impression: Technically successful ultrasound guided injection.  Impression and Recommendations:    Lumbar degenerative disc disease Essentially no relief with an epidural, temporary but good response to a right SI joint injection in the past. She will continue working with weight loss with Tandy GawJade Breeback, PA-C, I did encourage her to go back to Dr. Cherylann BanasMeloy for SI joint radiofrequency ablation. I am going to do another lidocaine/Marcaine injection into her right SI joint for further delineation as to whether this is the pain generator or not.  Obesity Entering the fourth month, refilling phentermine, further refills will be with her PCP

## 2016-02-27 NOTE — Assessment & Plan Note (Signed)
Essentially no relief with an epidural, temporary but good response to a right SI joint injection in the past. She will continue working with weight loss with Tandy GawJade Breeback, PA-C, I did encourage her to go back to Dr. Cherylann BanasMeloy for SI joint radiofrequency ablation. I am going to do another lidocaine/Marcaine injection into her right SI joint for further delineation as to whether this is the pain generator or not.

## 2016-04-20 ENCOUNTER — Other Ambulatory Visit: Payer: Self-pay | Admitting: Physician Assistant

## 2016-04-20 ENCOUNTER — Encounter: Payer: Self-pay | Admitting: Physician Assistant

## 2016-04-20 ENCOUNTER — Encounter: Payer: Self-pay | Admitting: Sports Medicine

## 2016-04-20 MED ORDER — PHENTERMINE HCL 37.5 MG PO TABS: 37.5000 mg | ORAL_TABLET | Freq: Every day | ORAL | 0 refills | Status: DC

## 2016-04-23 ENCOUNTER — Telehealth: Payer: Self-pay | Admitting: *Deleted

## 2016-04-23 NOTE — Telephone Encounter (Signed)
:   [id number] Approvedtoday  CaseId:[id number];Status:Approved;Review Type:Prior Auth;Coverage Start Date:03/24/2016;Coverage End Date:04/23/2017;  pharmacy notified

## 2016-05-11 ENCOUNTER — Encounter: Payer: Self-pay | Admitting: Physician Assistant

## 2016-05-11 DIAGNOSIS — M5136 Other intervertebral disc degeneration, lumbar region: Secondary | ICD-10-CM

## 2016-05-12 NOTE — Telephone Encounter (Signed)
Spoke with Pt to verify why we were sending to neurosurgery. Referral placed, Pt advised.

## 2016-05-28 ENCOUNTER — Encounter: Payer: Self-pay | Admitting: Physician Assistant

## 2016-05-28 ENCOUNTER — Encounter: Payer: Self-pay | Admitting: Sports Medicine

## 2016-05-28 DIAGNOSIS — M51369 Other intervertebral disc degeneration, lumbar region without mention of lumbar back pain or lower extremity pain: Secondary | ICD-10-CM

## 2016-05-28 DIAGNOSIS — M5136 Other intervertebral disc degeneration, lumbar region: Secondary | ICD-10-CM

## 2016-06-08 ENCOUNTER — Ambulatory Visit (INDEPENDENT_AMBULATORY_CARE_PROVIDER_SITE_OTHER): Payer: 59

## 2016-06-08 DIAGNOSIS — M5136 Other intervertebral disc degeneration, lumbar region: Secondary | ICD-10-CM

## 2016-06-08 DIAGNOSIS — M5116 Intervertebral disc disorders with radiculopathy, lumbar region: Secondary | ICD-10-CM

## 2016-06-08 DIAGNOSIS — M5117 Intervertebral disc disorders with radiculopathy, lumbosacral region: Secondary | ICD-10-CM | POA: Diagnosis not present

## 2016-06-12 DIAGNOSIS — G8929 Other chronic pain: Secondary | ICD-10-CM | POA: Insufficient documentation

## 2016-06-12 DIAGNOSIS — M25539 Pain in unspecified wrist: Secondary | ICD-10-CM

## 2016-07-01 ENCOUNTER — Encounter: Payer: Self-pay | Admitting: Occupational Therapy

## 2016-07-01 ENCOUNTER — Ambulatory Visit: Payer: 59 | Attending: Orthopedic Surgery | Admitting: Occupational Therapy

## 2016-07-01 DIAGNOSIS — M6281 Muscle weakness (generalized): Secondary | ICD-10-CM | POA: Diagnosis present

## 2016-07-01 DIAGNOSIS — R208 Other disturbances of skin sensation: Secondary | ICD-10-CM | POA: Diagnosis present

## 2016-07-01 DIAGNOSIS — M25531 Pain in right wrist: Secondary | ICD-10-CM | POA: Insufficient documentation

## 2016-07-01 DIAGNOSIS — M25532 Pain in left wrist: Secondary | ICD-10-CM | POA: Diagnosis present
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
## 2016-07-06 ENCOUNTER — Ambulatory Visit: Payer: 59 | Attending: Orthopedic Surgery | Admitting: Occupational Therapy

## 2016-07-06 DIAGNOSIS — M25532 Pain in left wrist: Secondary | ICD-10-CM | POA: Insufficient documentation

## 2016-07-06 DIAGNOSIS — M25531 Pain in right wrist: Secondary | ICD-10-CM | POA: Diagnosis present

## 2016-07-06 DIAGNOSIS — M6281 Muscle weakness (generalized): Secondary | ICD-10-CM | POA: Insufficient documentation
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
## 2016-07-06 NOTE — Patient Instructions (Signed)
Wrist Extension: Isometric    With left forearm resting palm down on thigh, resist upward movement of hand with other hand. Hold _10___ seconds. Relax. Repeat _5___ times per set.  Do __4-6__ sessions per day.   Flexion (Isometric)    With forearm held steady, palm up, use other hand to resist upward movement of hand at wrist. Hold _10___ seconds. Relax. Repeat __5__ times. Do _4-6__ sessions per day.   Wrist Radial Deviation: Isometric    With left forearm resting on thigh, thumb up, use other hand to resist upward movement of hand at wrist. Hold __10__ seconds. Relax. Repeat __5__ times per set. Do ____ sets per session. Do _4-6___ sessions per day.    RECOMMENDATIONS:   1. Wear neoprene brace during day especially during lifting or cleaning activities 2. Get electric can opener 3. Modify tasks like opening jars, wringing out washcloths, holding pots/pans, lifting heavy things, etc.  - this may require adaptive equipment or holding/lifting differently to keep wrist neutral when possible.  4. Also, bigger joints can take more, so use bigger joints to lift vs. wrists when possible

## 2016-07-08 ENCOUNTER — Ambulatory Visit: Payer: 59 | Admitting: Occupational Therapy

## 2016-07-08 DIAGNOSIS — M6281 Muscle weakness (generalized): Secondary | ICD-10-CM

## 2016-07-08 DIAGNOSIS — M25531 Pain in right wrist: Secondary | ICD-10-CM

## 2016-07-08 DIAGNOSIS — M25532 Pain in left wrist: Secondary | ICD-10-CM | POA: Diagnosis not present
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
## 2016-07-15 ENCOUNTER — Ambulatory Visit: Payer: 59 | Admitting: Occupational Therapy

## 2016-07-15 DIAGNOSIS — M25532 Pain in left wrist: Secondary | ICD-10-CM

## 2016-07-15 DIAGNOSIS — M6281 Muscle weakness (generalized): Secondary | ICD-10-CM

## 2016-07-15 DIAGNOSIS — M25531 Pain in right wrist: Secondary | ICD-10-CM
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
## 2016-07-20 ENCOUNTER — Ambulatory Visit: Payer: 59 | Admitting: Occupational Therapy

## 2016-07-20 DIAGNOSIS — M25531 Pain in right wrist: Secondary | ICD-10-CM

## 2016-07-20 DIAGNOSIS — M25532 Pain in left wrist: Secondary | ICD-10-CM | POA: Diagnosis not present

## 2016-07-20 DIAGNOSIS — M6281 Muscle weakness (generalized): Secondary | ICD-10-CM

## 2016-07-20 NOTE — Patient Instructions (Addendum)
  1. Grip Strengthening (Resistive Putty)   Squeeze putty using thumb and all fingers. Repeat _15___ times. Do __2__ sessions per day. Keep wrists neutral    Pronation / Supination (Resistive)    Hold hammer weighing __8__ ounces and rotate palm up and down. Keep elbow flexed at side and wrist straight. Repeat _10___ times each way. Do __2__ sessions per day. Keep wrists neutral
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
## 2016-07-21 ENCOUNTER — Encounter: Payer: 59 | Admitting: Occupational Therapy

## 2016-07-27 ENCOUNTER — Encounter: Payer: Self-pay | Admitting: Physician Assistant

## 2016-07-27 ENCOUNTER — Ambulatory Visit: Payer: 59 | Admitting: Occupational Therapy

## 2016-07-27 DIAGNOSIS — M25532 Pain in left wrist: Secondary | ICD-10-CM | POA: Diagnosis not present

## 2016-07-27 DIAGNOSIS — R87610 Atypical squamous cells of undetermined significance on cytologic smear of cervix (ASC-US): Secondary | ICD-10-CM

## 2016-07-27 DIAGNOSIS — M25531 Pain in right wrist: Secondary | ICD-10-CM

## 2016-07-27 DIAGNOSIS — M6281 Muscle weakness (generalized): Secondary | ICD-10-CM

## 2016-07-27 HISTORY — DX: Atypical squamous cells of undetermined significance on cytologic smear of cervix (ASC-US): R87.610
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
## 2016-07-27 NOTE — Patient Instructions (Signed)
SPLINT WEAR AND CARE  Your Splint This splint should initially be fitted by a healthcare practitioner.  The healthcare practitioner is responsible for providing wearing instructions and precautions to the patient, other healthcare practitioners and care provider involved in the patient's care.  This splint was custom made for you. Please read the following instructions to learn about wearing and caring for your splint.  Precautions Should your splint cause any of the following problems, remove the splint immediately and contact your therapist/physician.  Swelling  Severe Pain  Pressure Areas  Stiffness  Numbness  Do not wear your splint while operating machinery unless it has been fabricated for that purpose.  When To Wear Your Splint Where your splint according to your therapist/physician instructions. During aggravating activities (lifting, pushing/pulling, etc.) and at night as needed.   Cleaning of Your Splint 1. Keep your splint away from open flames. 2. Your splint will lose its shape in temperatures over 135 degrees Farenheit, ( in car windows, near radiators, ovens or in hot water).  Never make any adjustments to your splint, if the splint needs adjusting remove it and make an appointment to see your therapist. 3. Your splint may be cleaned with rubbing alcohol at least 1x/day.  Do not immerse in hot water over 135 degrees Farenheit. 4. Straps may be washed with soap and water, but do not moisten the self-adhesive portion. 5. For ink or hard to remove spots use a scouring cleanser which contains chlorine.  Rinse the splint thoroughly after using chlorine cleanser.

## 2016-07-30 ENCOUNTER — Ambulatory Visit: Payer: 59 | Admitting: Occupational Therapy

## 2016-07-30 ENCOUNTER — Ambulatory Visit (INDEPENDENT_AMBULATORY_CARE_PROVIDER_SITE_OTHER): Payer: 59 | Admitting: Physician Assistant

## 2016-07-30 ENCOUNTER — Encounter: Payer: Self-pay | Admitting: Physician Assistant

## 2016-07-30 VITALS — BP 118/75 | HR 74 | Resp 16 | Wt 180.9 lb

## 2016-07-30 DIAGNOSIS — M25531 Pain in right wrist: Secondary | ICD-10-CM

## 2016-07-30 DIAGNOSIS — M6281 Muscle weakness (generalized): Secondary | ICD-10-CM

## 2016-07-30 DIAGNOSIS — Z9229 Personal history of other drug therapy: Secondary | ICD-10-CM

## 2016-07-30 DIAGNOSIS — R635 Abnormal weight gain: Secondary | ICD-10-CM | POA: Diagnosis not present

## 2016-07-30 DIAGNOSIS — E663 Overweight: Secondary | ICD-10-CM | POA: Insufficient documentation

## 2016-07-30 DIAGNOSIS — M25532 Pain in left wrist: Secondary | ICD-10-CM

## 2016-07-30 MED ORDER — PHENTERMINE HCL 37.5 MG PO TABS: ORAL_TABLET | ORAL | 0 refills | Status: DC

## 2016-07-30 NOTE — Patient Instructions (Signed)
-   Increase physical activity to 4, 30 minute sessions per week - Limit calories to 1200 per day - Drink plenty of water - Avoid alcohol - Follow-up in 4 weeks for weight check  Physical Activity Recommendations for modifying lipids and lowering blood pressure Engage in aerobic physical activity to reduce LDL-cholesterol, non-HDL-cholesterol, and blood pressure  Frequency: 3-4 sessions per week  Intensity: moderate to vigorous  Duration: 40 minutes on average  Physical Activity Recommendations for secondary prevention 1. Aerobic exercise  Frequency: 3-5 sessions per week  Intensity: 50-80% capacity  Duration: 20 - 60 minutes  Examples: walking, treadmill, cycling, rowing, stair climbing, and arm/leg ergometry  2. Resistance exercise  Frequency: 2-3 sessions per week  Intensity: 10-15 repetitions/set to moderate fatigue  Duration: 1-3 sets of 8-10 upper and lower body exercises  Examples: calisthenics, elastic bands, cuff/hand weights, dumbbels, free weights, wall pulleys, and weight machines  Heart-Healthy Lifestyle  Eating a diet rich in vegetables, fruits and whole grains: also includes low-fat dairy products, poultry, fish, legumes, and nuts; limit intake of sweets, sugar-sweetened beverages and red meats  Getting regular exercise  Maintaining a healthy weight  Not smoking or getting help quitting  Staying on top of your health; for some people, lifestyle changes alone may not be enough to prevent a heart attack or stroke. In these cases, taking a statin at the right dose will most likely be necessary

## 2016-07-30 NOTE — Progress Notes (Signed)
HPI:                                                                Erika Gay is a 36 y.o. female [name] today for weight gain  Patient presents today requesting Phentermine. She has been prescribed this in the past by her Sports Medicine physician. Patient has a history of lumbar degenerative disc disease that limits ability to rigorously exercise. She was evaluated by a spine surgeon, who recommended surgery.  Patient reports she last took Phentermine in April. Since then she has re-gained about 7 pounds. Her goal weight 165 pounds. Reports she has increased intake of water and fresh fruits and vegetables. She is currently exercising, 2 days per week on the treadmill and some at-home zumba videos. She denies any headache, palpitations or insomnia while taking Phentermine.    Past Medical History:  Diagnosis Date  . Asymptomatic gallstones 10/15/2015   Via CT scan 10/2015.   Marland Kitchen. Costochondritis 08/01/2015  . Lumbar degenerative disc disease 08/01/2015  . Pelvic pain    Past Surgical History:  Procedure Laterality Date  . REFRACTIVE SURGERY    . WISDOM TOOTH EXTRACTION     Social History  Substance Use Topics  . Smoking status: Never Smoker  . Smokeless tobacco: Never Used  . Alcohol use No   family history includes Diabetes in her other; Heart attack in her maternal grandfather; Hypertension in her maternal grandmother and mother; Stroke in her maternal grandmother.  ROS: negative except as noted in the HPI  Medications: Current Outpatient Prescriptions  Medication Sig Dispense Refill  . ibuprofen (ADVIL,MOTRIN) 600 MG tablet Take 1 tablet (600 mg total) by mouth every 8 (eight) hours as needed. 90 tablet 3  . Multiple Vitamin (MULTIVITAMIN WITH MINERALS) TABS tablet Take 1 tablet by mouth daily.    Marland Kitchen. omega-3 acid ethyl esters (LOVAZA) 1 g capsule Take by mouth 2 (two) times daily.    . phentermine  (ADIPEX-P) 37.5 MG tablet One tab by mouth qAM 30 tablet 0  . SPRINTEC 28 0.25-35 MG-MCG tablet      No current facility-administered medications for this visit.    Allergies  Allergen Reactions  . Mobic [Meloxicam]     nausea       Objective:  BP 118/75   Pulse 74   Resp 16   Wt 180 lb 14.4 oz (82.1 kg)   BMI 28.76 kg/m  Gen: well-groomed, cooperative, not ill-appearing, no distress Pulm: Normal work of breathing, normal phonation, clear to auscultation bilaterally CV: Normal rate, regular rhythm, s1 and s2 distinct, no murmurs, clicks or rubs  Neuro: alert and oriented x 3, EOM's intact, no tremor MSK: moving all extremities, normal gait and station, no peripheral edema Psych: good eye contact, normal affect, normal speech and thought content    No results found for this or any previous visit (from the past 72 hour(s)). No results found.    Assessment and Plan: 36 y.o. female with   1. History of varicella vaccination - Varicella zoster antibody, IgG  2. Abnormal weight gain - vital signs reviewed. No hypertension or tachycardia - okay to continue phentermine for 1 month - encouraged to increase physical activity to 4  days per week as tolerated - limit calories to 1200 per day. Avoid alcohol - drink plenty of water - phentermine (ADIPEX-P) 37.5 MG tablet; One tab by mouth qAM  Dispense: 30 tablet; Refill: 0  3. Overweight (BMI 25.0-29.9) - phentermine (ADIPEX-P) 37.5 MG tablet; One tab by mouth qAM  Dispense: 30 tablet; Refill: 0  Patient education and anticipatory guidance given Patient agrees with treatment plan Follow-up in 4 weeks for weight check or sooner as needed if symptoms worsen or fail to improve  I spent 25 minutes with this patient, greater than 50% was face-to-face time counseling regarding the above diagnoses  Levonne Hubert PA-C
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
## 2016-07-31 LAB — VARICELLA ZOSTER ANTIBODY, IGG: Varicella IgG: 986.3 Index — ABNORMAL HIGH (ref <135.00)

## 2016-08-03 ENCOUNTER — Telehealth: Payer: Self-pay | Admitting: Physician Assistant

## 2016-08-03 NOTE — Telephone Encounter (Signed)
Pt called. She wants  us to call her with results from her Industrial/product designerVericella Booster Shot also fax results Alejandro MullingAttn Judy at fax [phone]. Thank you.

## 2016-08-03 NOTE — Telephone Encounter (Signed)
Faxed results.[name], [name] lvm informing pt.[name], [name]

## 2016-08-03 NOTE — Progress Notes (Signed)
Hi Alvi,  Your titer was positive, meaning you have immunity to varicella.  Best, Vinetta Bergamoharley

## 2016-08-12 ENCOUNTER — Ambulatory Visit: Payer: 59 | Admitting: Occupational Therapy

## 2016-08-13 ENCOUNTER — Ambulatory Visit: Payer: 59 | Attending: Orthopedic Surgery | Admitting: Occupational Therapy

## 2016-08-13 DIAGNOSIS — M6281 Muscle weakness (generalized): Secondary | ICD-10-CM | POA: Insufficient documentation

## 2016-08-13 DIAGNOSIS — M25532 Pain in left wrist: Secondary | ICD-10-CM | POA: Insufficient documentation

## 2016-08-13 DIAGNOSIS — M25531 Pain in right wrist: Secondary | ICD-10-CM | POA: Insufficient documentation
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
## 2016-08-13 NOTE — Patient Instructions (Signed)
   DO NOT DO ON LEFT UNTIL YOU ARE CONSISTENTLY 3/10 OR UNDER PAIN FOR A MONTH. CAN BEGIN WITH RT HAND, BUT STOP IF PAIN INCREASES   1. Wrist Extension: Resisted    With right palm down, _1 - 2___ pound weight in hand, bend wrist up. Return slowly. Repeat _10___ times per set. Do _1___ sets per session. Do _2-3___ sessions per day.   2.  Flexion (Resistive)    With hand palm-up and holding __1-2__ LBS, bend hand toward you at wrist.  Relax slowly. Repeat __10__ times. Do __2-3__ sessions per day.

## 2016-09-11 ENCOUNTER — Encounter: Payer: Self-pay | Admitting: Physician Assistant

## 2016-12-17 LAB — OB RESULTS CONSOLE RUBELLA ANTIBODY, IGM: RUBELLA: NON-IMMUNE/NOT IMMUNE

## 2016-12-17 LAB — OB RESULTS CONSOLE RPR: RPR: NONREACTIVE

## 2016-12-17 LAB — OB RESULTS CONSOLE GBS: GBS: POSITIVE

## 2016-12-17 LAB — OB RESULTS CONSOLE ABO/RH: RH Type: POSITIVE

## 2016-12-17 LAB — OB RESULTS CONSOLE HIV ANTIBODY (ROUTINE TESTING): HIV: NONREACTIVE

## 2016-12-17 LAB — OB RESULTS CONSOLE HEPATITIS B SURFACE ANTIGEN: HEP B S AG: NEGATIVE

## 2016-12-17 LAB — OB RESULTS CONSOLE ANTIBODY SCREEN: Antibody Screen: NEGATIVE

## 2016-12-17 LAB — OB RESULTS CONSOLE GC/CHLAMYDIA
Chlamydia: NEGATIVE
Gonorrhea: NEGATIVE

## 2017-01-05 LAB — HM PAP SMEAR: HM PAP: NEGATIVE

## 2017-03-17 ENCOUNTER — Inpatient Hospital Stay (HOSPITAL_COMMUNITY)
Admission: AD | Admit: 2017-03-17 | Discharge: 2017-03-17 | Disposition: A | Discharge status: Home / Self Care | Payer: Medicaid Other | Source: Ambulatory Visit | Attending: Obstetrics and Gynecology | Admitting: Obstetrics and Gynecology

## 2017-03-17 ENCOUNTER — Other Ambulatory Visit: Payer: Self-pay

## 2017-03-17 ENCOUNTER — Encounter (HOSPITAL_COMMUNITY): Payer: Self-pay | Admitting: *Deleted

## 2017-03-17 DIAGNOSIS — R109 Unspecified abdominal pain: Secondary | ICD-10-CM

## 2017-03-17 DIAGNOSIS — O26892 Other specified pregnancy related conditions, second trimester: Secondary | ICD-10-CM | POA: Insufficient documentation

## 2017-03-17 DIAGNOSIS — Z3A20 20 weeks gestation of pregnancy: Secondary | ICD-10-CM | POA: Insufficient documentation

## 2017-03-17 DIAGNOSIS — O219 Vomiting of pregnancy, unspecified: Secondary | ICD-10-CM

## 2017-03-17 DIAGNOSIS — R531 Weakness: Secondary | ICD-10-CM

## 2017-03-17 DIAGNOSIS — O210 Mild hyperemesis gravidarum: Secondary | ICD-10-CM | POA: Diagnosis not present

## 2017-03-17 HISTORY — DX: Unspecified infectious disease: B99.9

## 2017-03-17 LAB — URINALYSIS, ROUTINE W REFLEX MICROSCOPIC
Bacteria, UA: NONE SEEN
Bilirubin Urine: NEGATIVE
Glucose, UA: NEGATIVE mg/dL
Hgb urine dipstick: NEGATIVE
Ketones, ur: 20 mg/dL — AB
Leukocytes, UA: NEGATIVE
Nitrite: NEGATIVE
Protein, ur: 30 mg/dL — AB
Specific Gravity, Urine: 1.027 (ref 1.005–1.030)
pH: 6 (ref 5.0–8.0)

## 2017-03-17 MED ORDER — DEXTROSE 5 % IN LACTATED RINGERS IV BOLUS
1000.0000 mL | Freq: Once | INTRAVENOUS | Status: AC
  Administered 2017-03-17: 1000 mL via INTRAVENOUS

## 2017-03-17 MED ORDER — LACTATED RINGERS IV BOLUS (SEPSIS)
1000.0000 mL | Freq: Once | INTRAVENOUS | Status: AC
  Administered 2017-03-17: 1000 mL via INTRAVENOUS

## 2017-03-17 MED ORDER — ONDANSETRON HCL 4 MG/2ML IJ SOLN
4.0000 mg | Freq: Once | INTRAMUSCULAR | Status: AC
  Administered 2017-03-17: 4 mg via INTRAVENOUS
  Filled 2017-03-17: qty 2

## 2017-03-17 MED ORDER — ONDANSETRON 4 MG PO TBDP: 4.0000 mg | ORAL_TABLET | Freq: Three times a day (TID) | ORAL | 1 refills | Status: DC | PRN

## 2017-03-17 NOTE — MAU Provider Note (Signed)
Chief Complaint: Extremity Weakness; Emesis; and Abdominal Pain   First Provider Initiated Contact with Patient 03/17/17 1406      SUBJECTIVE HPI: Erika Gay is a 37 y.o. G2P1001 at [redacted]w[redacted]d by LMP who presents to maternity admissions reporting abdominal pain, weakness, and N/V. She reports N/V that started last night one hour after eating [employer]. She reports vomiting more than 15+ times last night and believes to have "food poisoning". She states that she has not has issues with emesis during her pregnancy and does not have any medication prescribed for N/V. She reports the abdominal pain started after vomiting multiple times, she specifies the pain as being in her upper abdomen and rates pain 7/10- has not taken any medication for pain d/t the patient not "liking to take medication". She reports feeling weak and tired from constant vomiting last night and not being able to eat or drink anything since last night. She denies vaginal bleeding, vaginal itching/burning, urinary symptoms, h/a, or fever/chills.     Past Medical History:  Diagnosis Date  . Asymptomatic gallstones 10/15/2015   Via CT scan 10/2015.   Marland Kitchen Atypical squamous cells of undetermined significance on cytologic smear of cervix (ASC-US) 07/27/2016  . Costochondritis 08/01/2015  . Infection    UTI  . Lumbar degenerative disc disease 08/01/2015  . Myofascial pain 05/24/2015  . Pelvic pain    Past Surgical History:  Procedure Laterality Date  . NO PAST SURGERIES    . REFRACTIVE SURGERY    . WISDOM TOOTH EXTRACTION     Social History   Socioeconomic History  . Marital status: Married    Spouse name: Not on file  . Number of children: Not on file  . Years of education: Not on file  . Highest education level: Not on file  Social Needs  . Financial resource strain: Not on file  . Food insecurity - worry: Not on file  . Food insecurity - inability: Not on file  . Transportation needs - medical: Not on file  .  Transportation needs - non-medical: Not on file  Occupational History  . Not on file  Tobacco Use  . Smoking status: Never Smoker  . Smokeless tobacco: Never Used  Substance and Sexual Activity  . Alcohol use: No  . Drug use: No  . Sexual activity: Yes    Partners: Male  Other Topics Concern  . Not on file  Social History Narrative  . Not on file   No current facility-administered medications on file prior to encounter.    Current Outpatient Medications on File Prior to Encounter  Medication Sig Dispense Refill  . Prenatal Vit-Fe Fumarate-FA (PRENATAL MULTIVITAMIN) TABS tablet Take 1 tablet by mouth daily at 12 noon.     Allergies  Allergen Reactions  . Mobic [Meloxicam]     nausea    ROS:  Review of Systems  Constitutional: Positive for fatigue.  Respiratory: Negative.   Cardiovascular: Negative.   Gastrointestinal: Positive for abdominal pain, nausea and vomiting. Negative for constipation and diarrhea.  Genitourinary: Negative.   Musculoskeletal: Negative.   Neurological: Positive for weakness. Negative for dizziness and headaches.  Psychiatric/Behavioral: Negative.    I have reviewed patient's Past Medical Hx, Surgical Hx, Family Hx, Social Hx, medications and allergies.   Physical Exam   Patient Vitals for the past 24 hrs:  BP Temp Temp src Pulse Resp SpO2 Weight  03/17/17 1630 117/67 98.4 F (36.9 C) Oral 75 16 100 % -  03/17/17 1221 132/76 98.2  F (36.8 C) Oral 84 17 100 % 191 lb (86.6 kg)   Constitutional: Well-developed, well-nourished female with a "sickly and pale appearance" Cardiovascular: normal rate Respiratory: normal effort GI: Abd soft, tender in epigastric region. Pos BS x 4 MS: Extremities nontender, no edema, normal ROM Neurologic: Alert and oriented x 4.  GU: Neg CVAT.  PELVIC EXAM: deferred   FHT 156 by doppler  LAB RESULTS Results for orders placed or performed during the hospital encounter of 03/17/17 (from the past 24 hour(s))   Urinalysis, Routine w reflex microscopic     Status: Abnormal   Collection Time: 03/17/17 12:29 PM  Result Value Ref Range   Color, Urine YELLOW YELLOW   APPearance HAZY (A) CLEAR   Specific Gravity, Urine 1.027 1.005 - 1.030   pH 6.0 5.0 - 8.0   Glucose, UA NEGATIVE NEGATIVE mg/dL   Hgb urine dipstick NEGATIVE NEGATIVE   Bilirubin Urine NEGATIVE NEGATIVE   Ketones, ur 20 (A) NEGATIVE mg/dL   Protein, ur 30 (A) NEGATIVE mg/dL   Nitrite NEGATIVE NEGATIVE   Leukocytes, UA NEGATIVE NEGATIVE   RBC / HPF 0-5 0 - 5 RBC/hpf   WBC, UA 0-5 0 - 5 WBC/hpf   Bacteria, UA NONE SEEN NONE SEEN   Squamous Epithelial / LPF 0-5 (A) NONE SEEN   Mucus PRESENT     MAU Management/MDM: Orders Placed This Encounter  Procedures  . Urinalysis, Routine w reflex microscopic  . Insert peripheral IV  UA- showed 20 ketones and 30 protein in urine, IV fluid bolus given for ketones and protein.   Meds ordered this encounter  Medications  . dextrose 5% lactated ringers bolus 1,000 mL  . ondansetron (ZOFRAN) injection 4 mg  . lactated ringers bolus 1,000 mL  . ondansetron (ZOFRAN ODT) 4 MG disintegrating tablet    Sig: Take 1 tablet (4 mg total) by mouth every 8 (eight) hours as needed for nausea or vomiting.    Dispense:  30 tablet    Refill:  1    Order Specific Question:   Supervising Provider    Answer:   CONSTANT, PEGGY [[id number]]   Treatments in MAU included 1,02000ml D5LR bolus, 4mg  Zofran IV, 1,000 ml LR IV. Pt able to keep down gingerale and crackers after treatment. Pt color has returned and she reports feeling better than at arrival.   Consult Dr Estanislado Pandyivard with assessment and management. Okay to discharge home.     Pt discharged. Pt stable at time of discharge. Discussed safe use of Tylenol during pregnancy. Follow up as scheduled. Return to MAU as needed.   ASSESSMENT 1. Nausea and vomiting during pregnancy prior to [redacted] weeks gestation   2. Abdominal pain during pregnancy in second trimester   3.  Weakness generalized     PLAN Discharge home Rx for Zofran 4mg  ODT sent to pharmacy of choice Follow up as scheduled for prenatal visits  Return to MAU as needed  Allergies as of 03/17/2017      Reactions   Mobic [meloxicam]    nausea      Medication List    TAKE these medications   ondansetron 4 MG disintegrating tablet Commonly known as:  ZOFRAN ODT Take 1 tablet (4 mg total) by mouth every 8 (eight) hours as needed for nausea or vomiting.   prenatal multivitamin Tabs tablet Take 1 tablet by mouth daily at 12 noon.      Steward DroneVeronica Zian Mohamed  Certified Nurse-Midwife 03/17/2017  4:36 PM

## 2017-03-17 NOTE — Discharge Instructions (Signed)

## 2017-03-17 NOTE — MAU Note (Signed)
Must've eaten something last night that gave her food poisoning.  Has been throwing up non-stop since 2200.  Has not been able to keep anything down.  Cramping, feels weak, is afraid she is getting dehydrated.

## 2017-07-08 IMAGING — DX DG RIBS W/ CHEST 3+V*L*
3 series · 3 of 3 positions shown · non-contrast
Comparison: None.

CLINICAL DATA: Chronic pain

EXAM:
LEFT RIBS AND CHEST - 3+ VIEW

[chest pa]
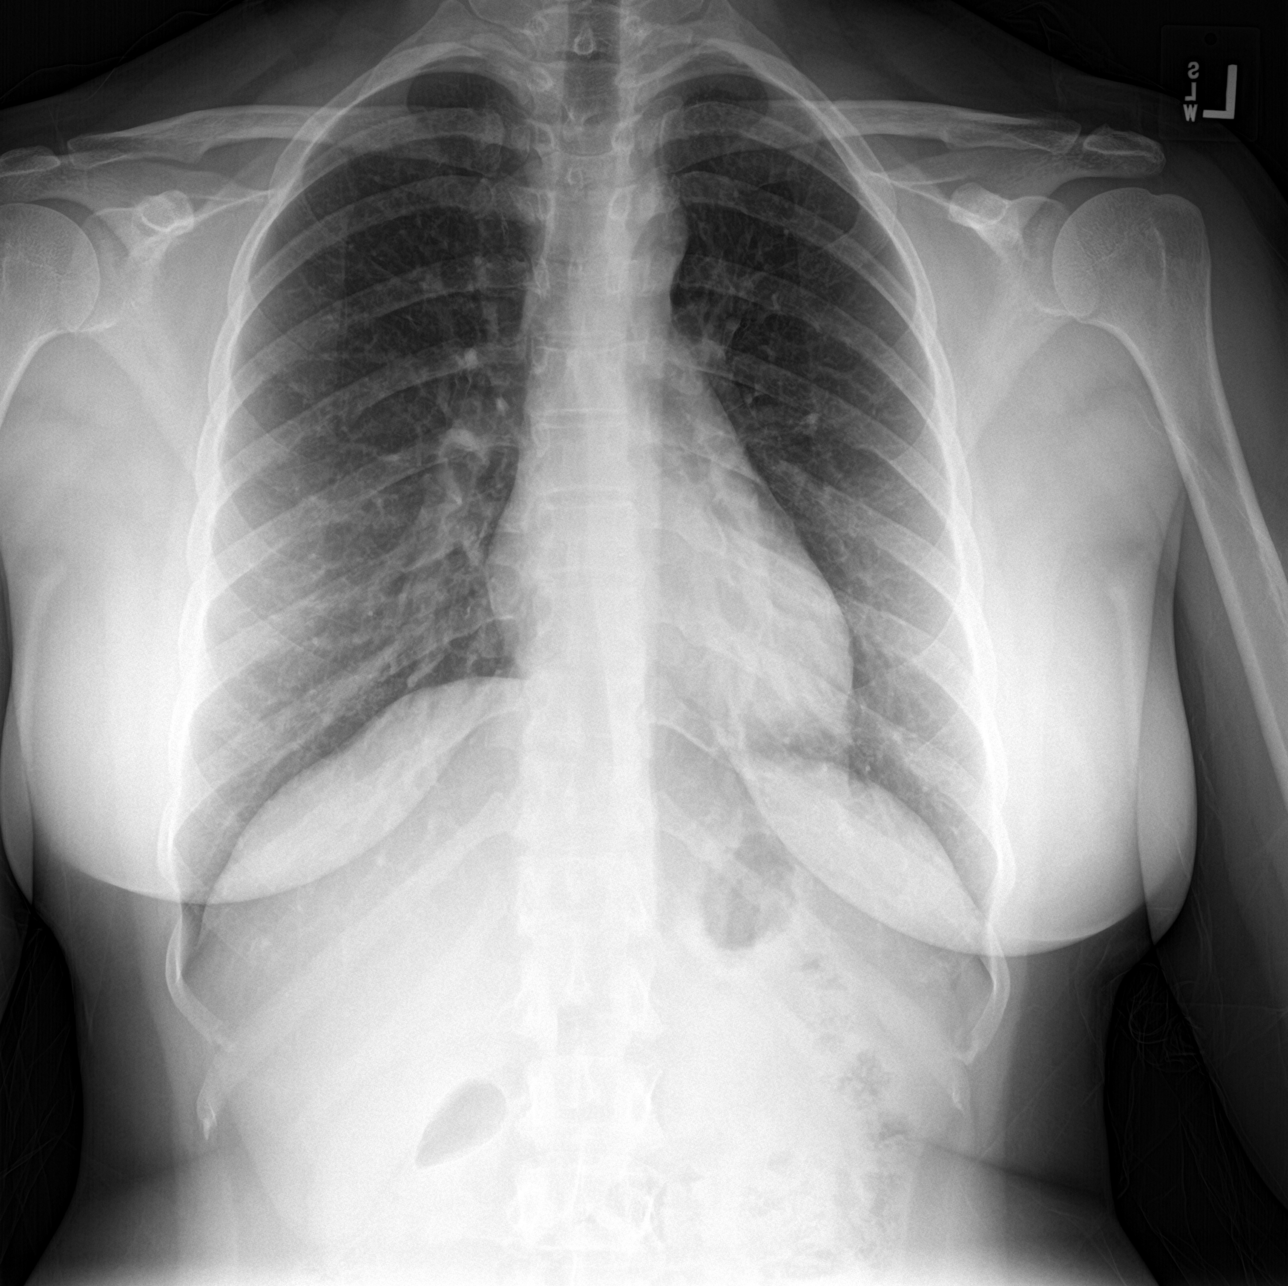

[rib ap]
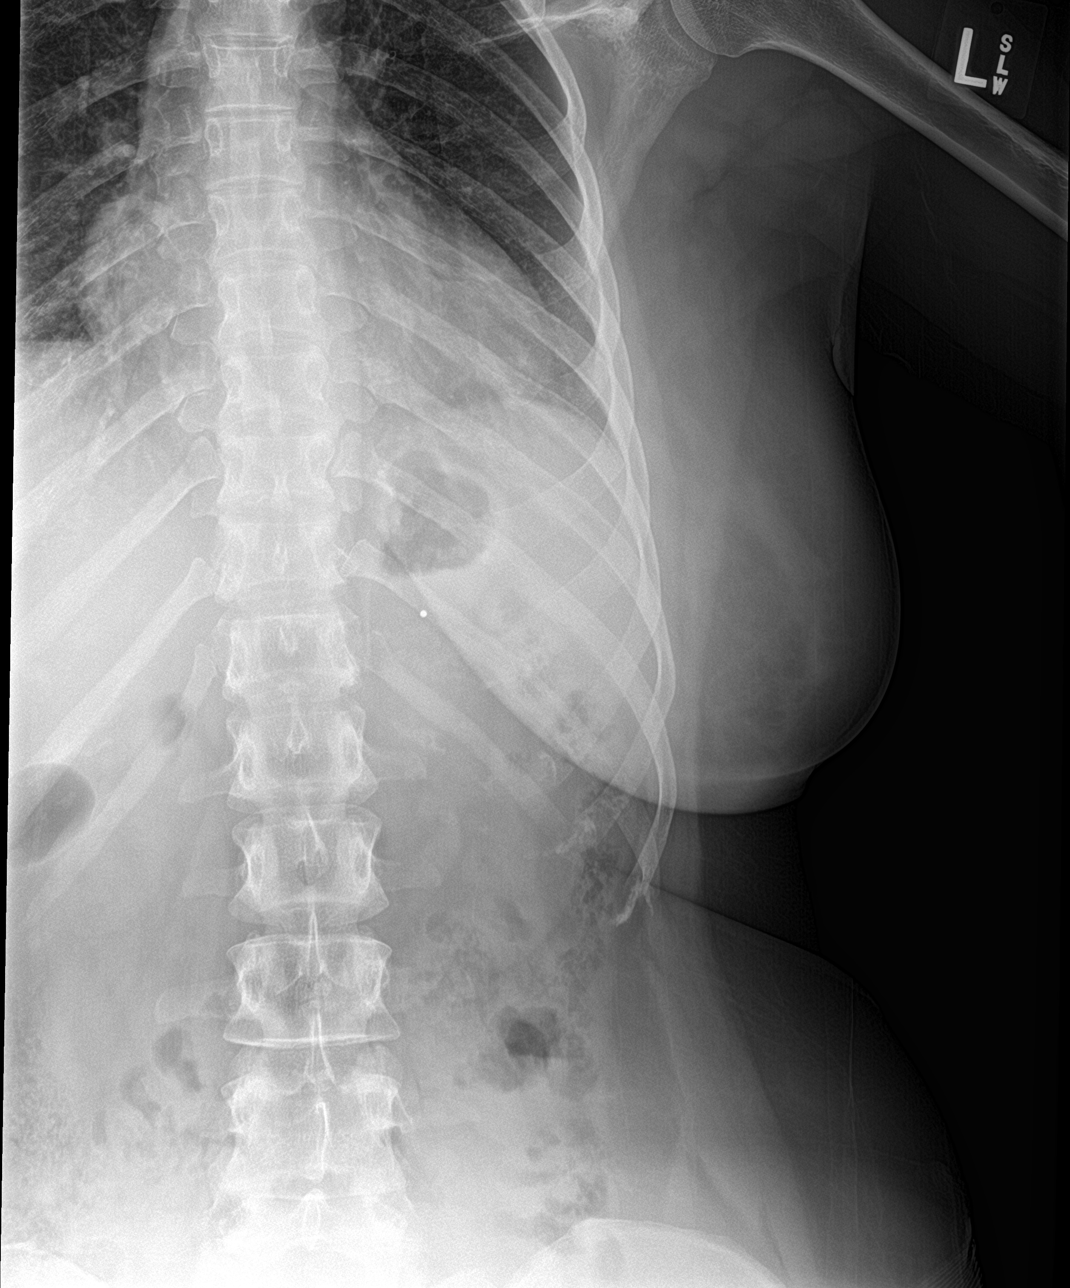

[rib ap obl]
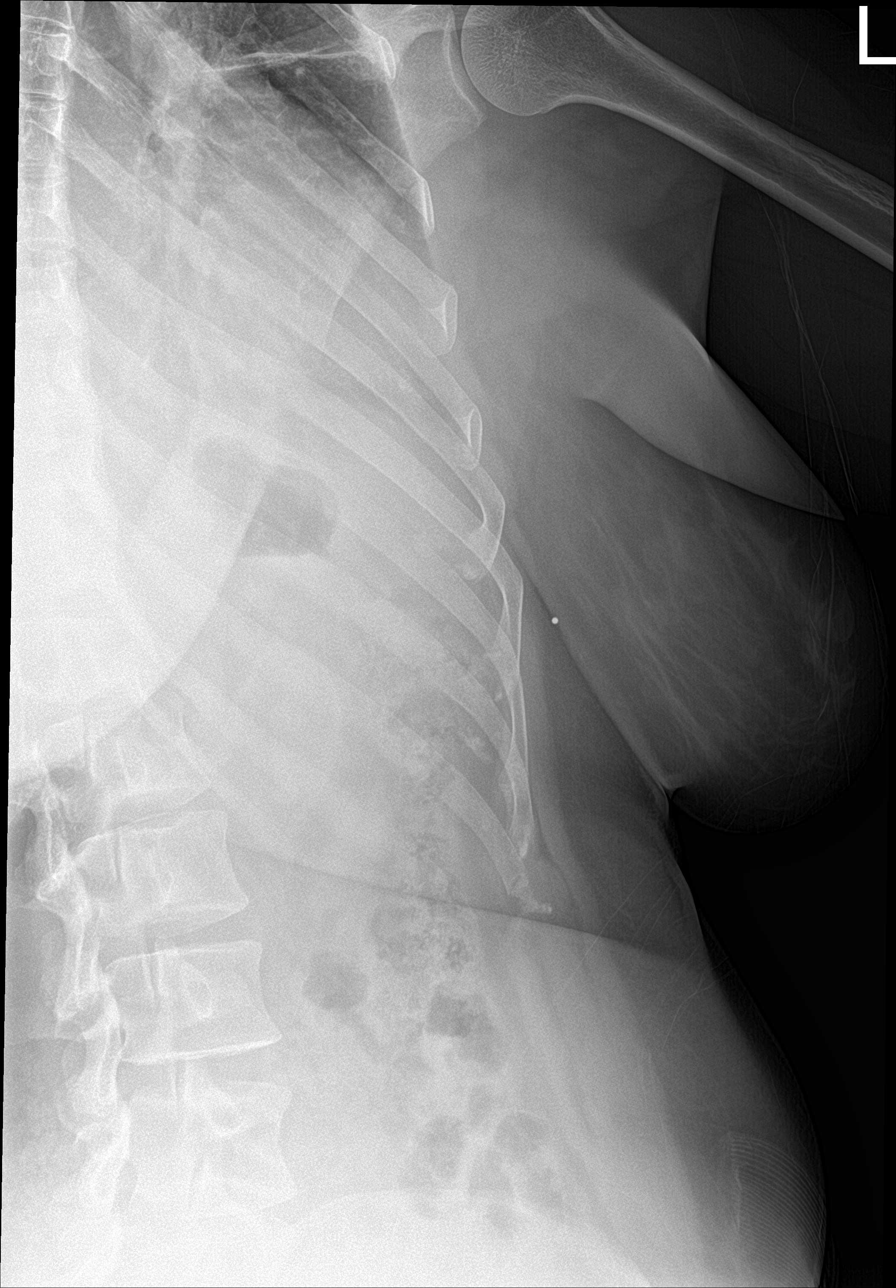

[3 of 3 positions shown; findings below may reference images not displayed]

FINDINGS: Frontal chest as well as oblique and cone-down lower rib images were
obtained. Lungs are clear. Heart size and pulmonary vascularity are
normal. No adenopathy. There is no evident pneumothorax or pleural
effusion. There is no evident rib fracture.
IMPRESSION: No evident rib fracture.  Lungs clear.

## 2017-07-14 ENCOUNTER — Encounter (HOSPITAL_COMMUNITY): Payer: Self-pay

## 2017-07-14 ENCOUNTER — Telehealth (HOSPITAL_COMMUNITY): Payer: Self-pay | Admitting: *Deleted

## 2017-07-14 NOTE — Telephone Encounter (Signed)
Preadmission screen  

## 2017-07-15 ENCOUNTER — Encounter (HOSPITAL_COMMUNITY): Payer: Self-pay

## 2017-07-15 IMAGING — CT CT CHEST W/O CM
2 of 3 series · 15 of 36 positions shown, 18 images · non-contrast
Comparison: None.

CLINICAL DATA: Rib pain

EXAM:
CT CHEST WITHOUT CONTRAST
TECHNIQUE: Multidetector CT imaging of the chest was performed following the
standard protocol without IV contrast.

[Series 2: thorax · axial · 0.70mm/px · z∈[-344,-39]mm · 12 of 73 slices shown, 15 images]
[im 6/73  mediastinal]
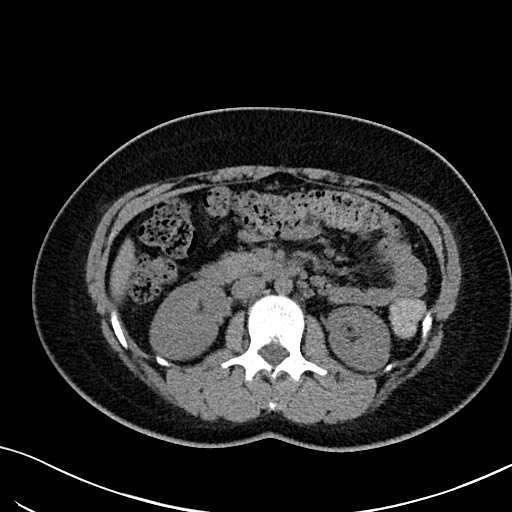
[im 6/73  lung]
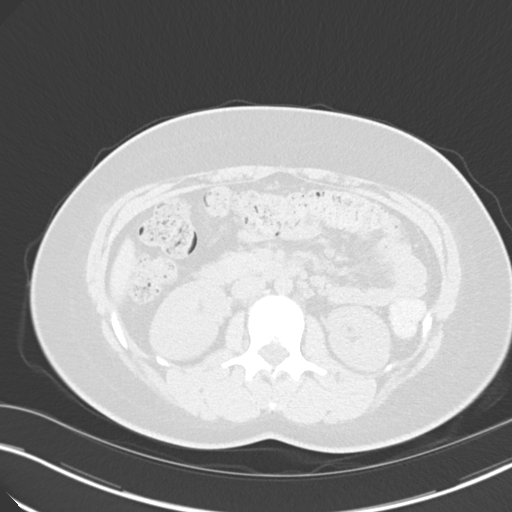
[im 11/73  lung]
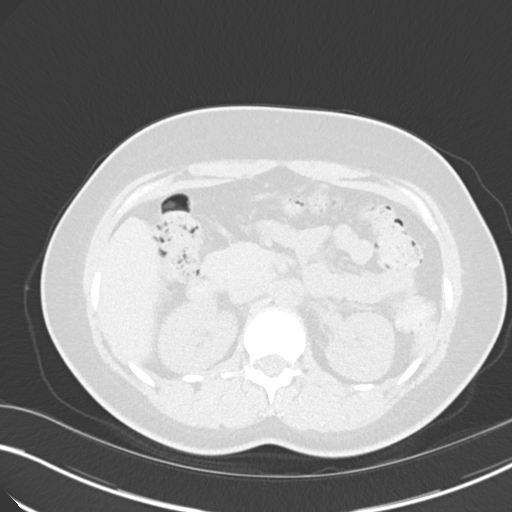
[im 17/73  lung]
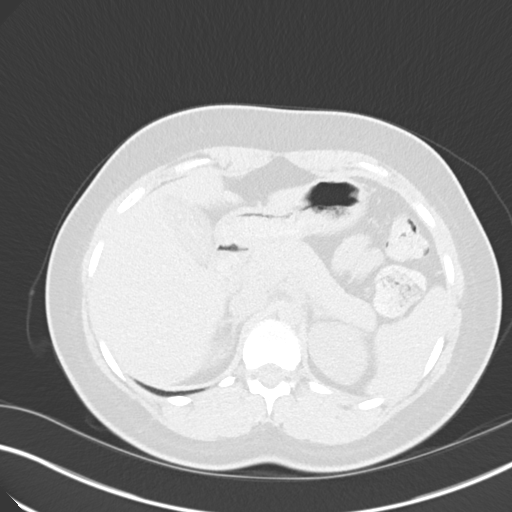
[im 22/73  lung]
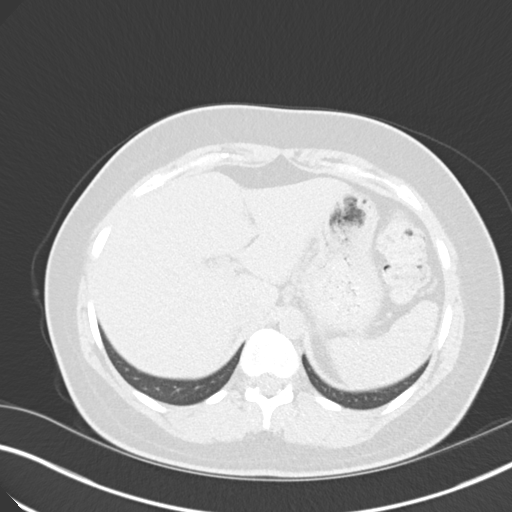
[im 27/73  mediastinal]
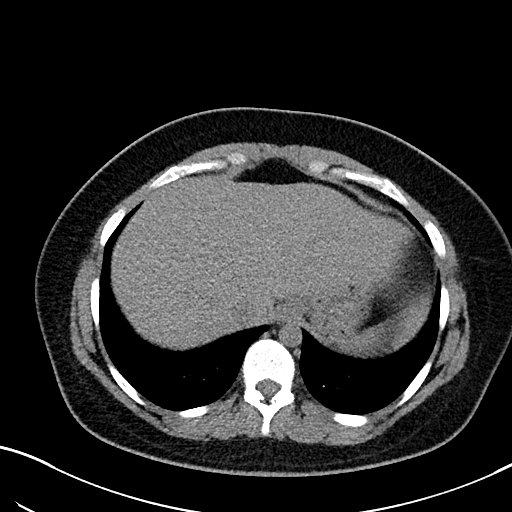
[im 27/73  lung]
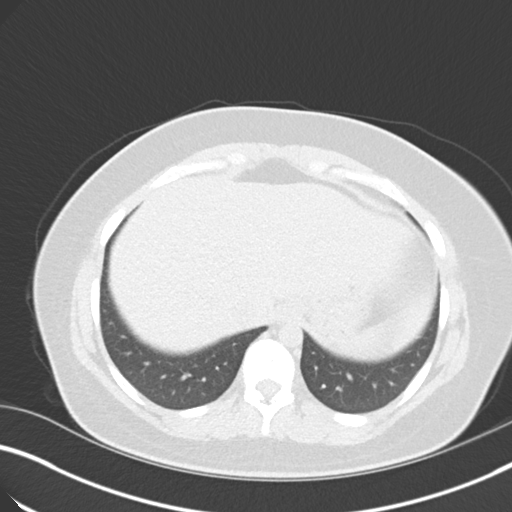
[im 33/73  lung]
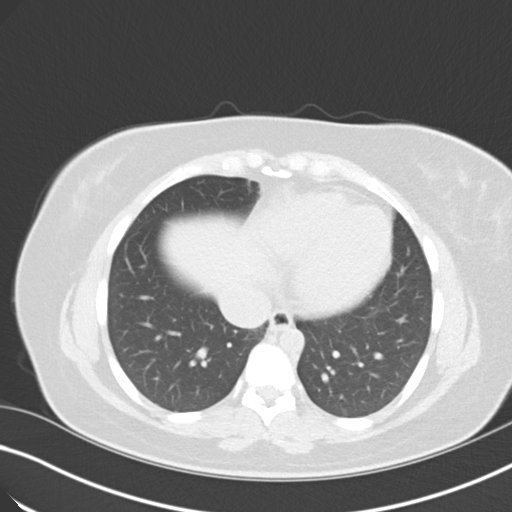
[im 41/73  lung]
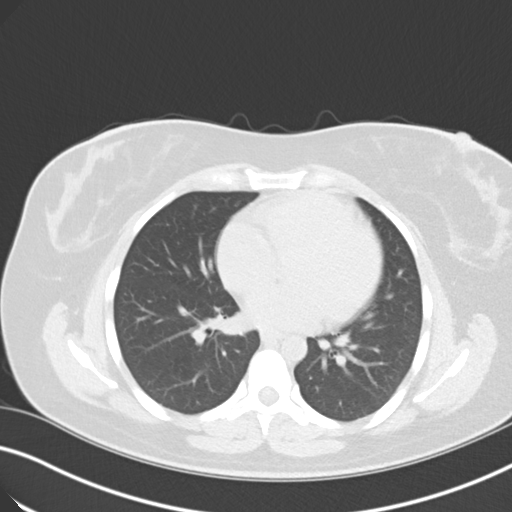
[im 46/73  lung]
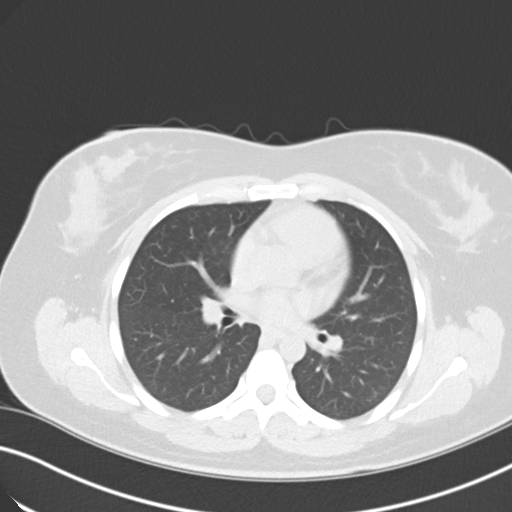
[im 51/73  mediastinal]
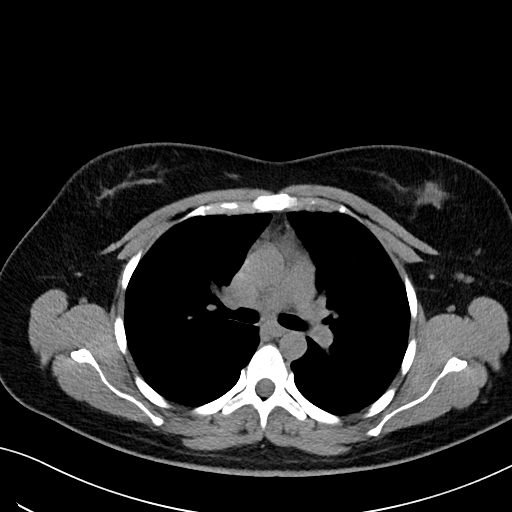
[im 51/73  lung]
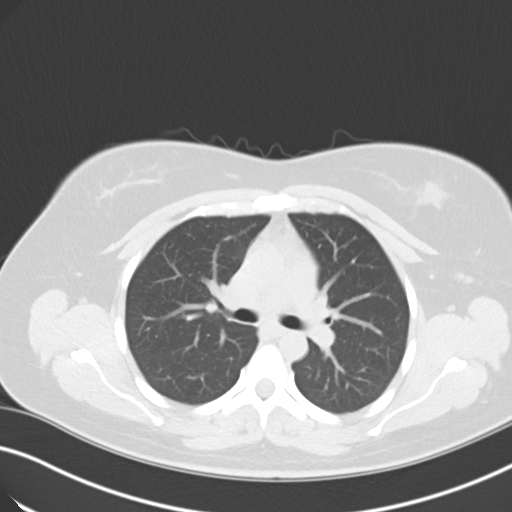
[im 57/73  lung]
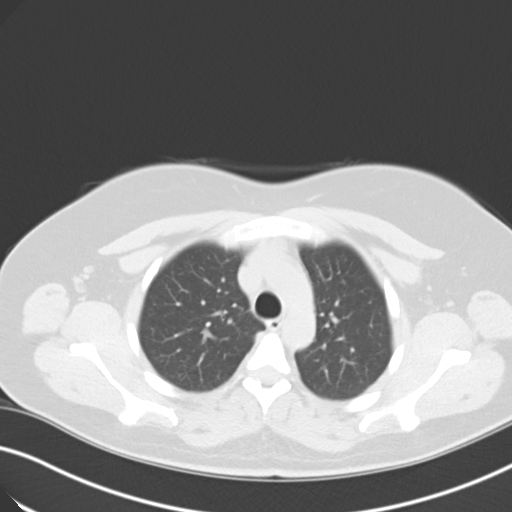
[im 62/73  lung]
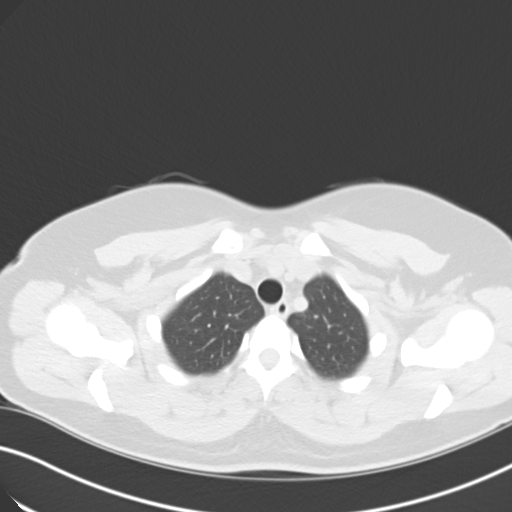
[im 67/73  lung]
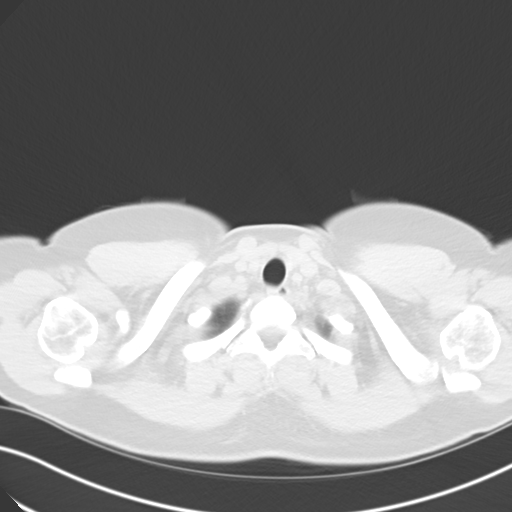

[Series 5: coronal · coronal · 0.72mm/px · 3 of 116 slices shown]
[im 24/116  lung]
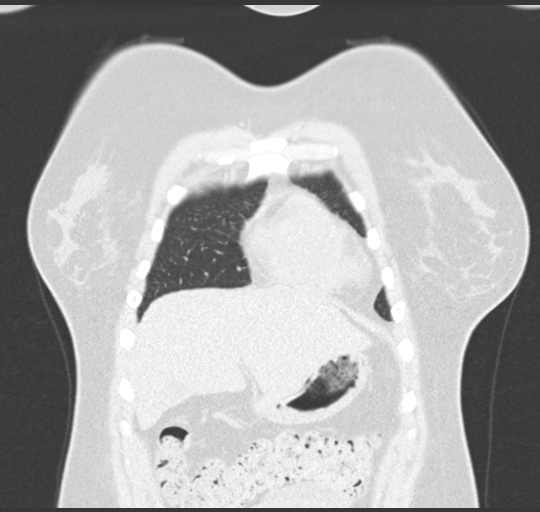
[im 47/116  lung]
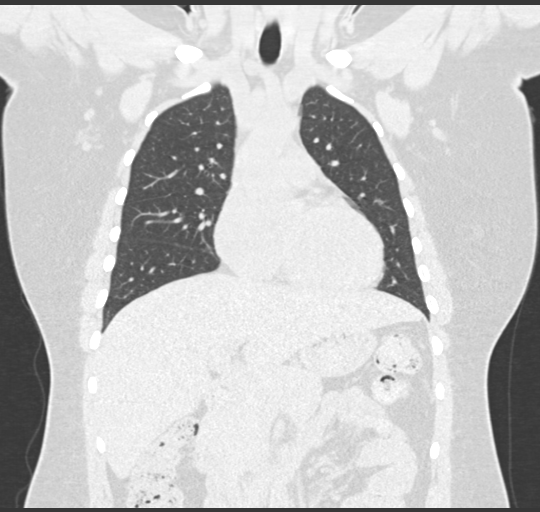
[im 70/116  lung]
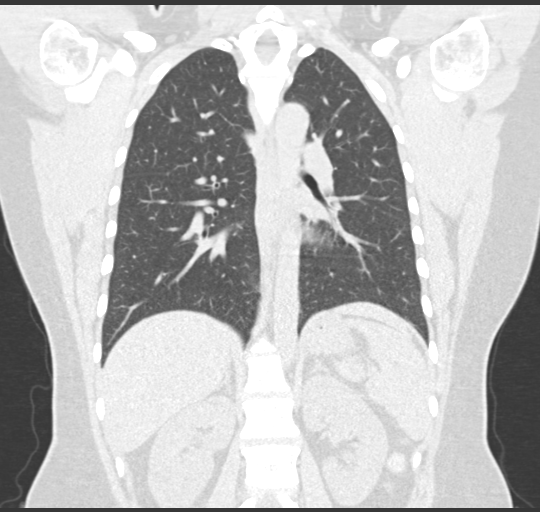

[15 of 36 positions shown; findings below may reference images not displayed]

FINDINGS: Cardiovascular: The heart size appears normal. There is no
pericardial effusion. Aortic atherosclerosis noted.

Mediastinum/Nodes: The trachea appears patent and is midline. Normal
appearance of the esophagus. No enlarged mediastinal or hilar
adenopathy.

Lungs/Pleura: There is no pleural effusion or pulmonary edema. No
airspace consolidation or atelectasis.

Upper Abdomen: There are multiple stones identified within the
gallbladder. No gallbladder wall thickening or pericholecystic
fluid. No acute findings identified within the upper abdomen.

Musculoskeletal: No aggressive lytic or sclerotic bone lesions.
IMPRESSION: 1. Normal exam.

## 2017-07-16 ENCOUNTER — Other Ambulatory Visit: Payer: Self-pay | Admitting: Obstetrics & Gynecology

## 2017-07-24 ENCOUNTER — Inpatient Hospital Stay (HOSPITAL_COMMUNITY)
Admission: AD | Admit: 2017-07-24 | Discharge: 2017-07-26 | DRG: 788 | Disposition: A | Discharge status: Home / Self Care | Payer: Medicaid Other | Attending: Obstetrics and Gynecology | Admitting: Obstetrics and Gynecology

## 2017-07-24 ENCOUNTER — Encounter (HOSPITAL_COMMUNITY)
Admission: AD | Disposition: A | Discharge status: Home / Self Care | Payer: Self-pay | Source: Home / Self Care | Attending: Obstetrics and Gynecology

## 2017-07-24 ENCOUNTER — Encounter (HOSPITAL_COMMUNITY): Payer: Self-pay | Admitting: *Deleted

## 2017-07-24 ENCOUNTER — Inpatient Hospital Stay (HOSPITAL_COMMUNITY): Payer: Medicaid Other | Admitting: Certified Registered"

## 2017-07-24 DIAGNOSIS — O99824 Streptococcus B carrier state complicating childbirth: Secondary | ICD-10-CM | POA: Diagnosis present

## 2017-07-24 DIAGNOSIS — Z3A38 38 weeks gestation of pregnancy: Secondary | ICD-10-CM

## 2017-07-24 DIAGNOSIS — O321XX0 Maternal care for breech presentation, not applicable or unspecified: Principal | ICD-10-CM | POA: Diagnosis present

## 2017-07-24 DIAGNOSIS — O479 False labor, unspecified: Secondary | ICD-10-CM

## 2017-07-24 LAB — CBC WITH DIFFERENTIAL/PLATELET
Basophils Absolute: 0 10*3/uL (ref 0.0–0.1)
Basophils Relative: 0 %
EOS ABS: 0 10*3/uL (ref 0.0–0.7)
Eosinophils Relative: 0 %
HEMATOCRIT: 33.1 % — AB (ref 36.0–46.0)
HEMOGLOBIN: 10.9 g/dL — AB (ref 12.0–15.0)
LYMPHS ABS: 3.1 10*3/uL (ref 0.7–4.0)
LYMPHS PCT: 23 %
MCH: 27.4 pg (ref 26.0–34.0)
MCHC: 32.9 g/dL (ref 30.0–36.0)
MCV: 83.2 fL (ref 78.0–100.0)
MONOS PCT: 3 %
Monocytes Absolute: 0.3 10*3/uL (ref 0.1–1.0)
Neutro Abs: 9.9 10*3/uL — ABNORMAL HIGH (ref 1.7–7.7)
Neutrophils Relative %: 74 %
Platelets: 223 10*3/uL (ref 150–400)
RBC: 3.98 MIL/uL (ref 3.87–5.11)
RDW: 14 % (ref 11.5–15.5)
WBC: 13.4 10*3/uL — ABNORMAL HIGH (ref 4.0–10.5)

## 2017-07-24 LAB — CBC
HEMATOCRIT: 33.6 % — AB (ref 36.0–46.0)
Hemoglobin: 11.3 g/dL — ABNORMAL LOW (ref 12.0–15.0)
MCH: 27.6 pg (ref 26.0–34.0)
MCHC: 33.6 g/dL (ref 30.0–36.0)
MCV: 82.2 fL (ref 78.0–100.0)
Platelets: 209 10*3/uL (ref 150–400)
RBC: 4.09 MIL/uL (ref 3.87–5.11)
RDW: 13.9 % (ref 11.5–15.5)
WBC: 17.7 10*3/uL — ABNORMAL HIGH (ref 4.0–10.5)

## 2017-07-24 LAB — CREATININE, SERUM
Creatinine, Ser: 0.64 mg/dL (ref 0.44–1.00)
GFR calc Af Amer: >60 mL/min (ref >60)
GFR calc non Af Amer: >60 mL/min (ref >60)

## 2017-07-24 LAB — RPR: RPR: NONREACTIVE

## 2017-07-24 LAB — TYPE AND SCREEN
ABO/RH(D): B POS
Antibody Screen: NEGATIVE

## 2017-07-24 LAB — ABO/RH: ABO/RH(D): B POS

## 2017-07-24 SURGERY — Surgical Case
Anesthesia: Spinal

## 2017-07-24 MED ORDER — IBUPROFEN 600 MG PO TABS
600.0000 mg | ORAL_TABLET | Freq: Four times a day (QID) | ORAL | Status: DC
  Administered 2017-07-24 – 2017-07-26 (×8): 600 mg via ORAL
  Filled 2017-07-24 (×8): qty 1

## 2017-07-24 MED ORDER — FENTANYL CITRATE (PF) 100 MCG/2ML IJ SOLN
INTRAMUSCULAR | Status: AC
  Filled 2017-07-24: qty 2

## 2017-07-24 MED ORDER — OXYTOCIN 10 UNIT/ML IJ SOLN
INTRAMUSCULAR | Status: AC
  Filled 2017-07-24: qty 4

## 2017-07-24 MED ORDER — SOD CITRATE-CITRIC ACID 500-334 MG/5ML PO SOLN: 30.0000 mL | Freq: Once | ORAL | Status: DC

## 2017-07-24 MED ORDER — PHENYLEPHRINE 8 MG IN D5W 100 ML (0.08MG/ML) PREMIX OPTIME
INJECTION | INTRAVENOUS | Status: DC | PRN
  Administered 2017-07-24: 60 ug/min via INTRAVENOUS

## 2017-07-24 MED ORDER — SIMETHICONE 80 MG PO CHEW: 80.0000 mg | CHEWABLE_TABLET | ORAL | Status: DC | PRN

## 2017-07-24 MED ORDER — ACETAMINOPHEN 325 MG PO TABS
650.0000 mg | ORAL_TABLET | ORAL | Status: DC | PRN
  Administered 2017-07-25: 650 mg via ORAL
  Filled 2017-07-24: qty 2

## 2017-07-24 MED ORDER — OXYCODONE-ACETAMINOPHEN 5-325 MG PO TABS
1.0000 | ORAL_TABLET | ORAL | Status: DC | PRN
  Administered 2017-07-25: 1 via ORAL
  Filled 2017-07-24: qty 1

## 2017-07-24 MED ORDER — SIMETHICONE 80 MG PO CHEW
80.0000 mg | CHEWABLE_TABLET | ORAL | Status: DC
  Administered 2017-07-25 (×2): 80 mg via ORAL
  Filled 2017-07-24 (×2): qty 1

## 2017-07-24 MED ORDER — SCOPOLAMINE 1 MG/3DAYS TD PT72: 1.0000 | MEDICATED_PATCH | Freq: Once | TRANSDERMAL | Status: DC

## 2017-07-24 MED ORDER — MORPHINE SULFATE (PF) 0.5 MG/ML IJ SOLN
INTRAMUSCULAR | Status: DC | PRN
  Administered 2017-07-24: .2 mg via INTRATHECAL

## 2017-07-24 MED ORDER — SODIUM CHLORIDE 0.9% FLUSH: 3.0000 mL | INTRAVENOUS | Status: DC | PRN

## 2017-07-24 MED ORDER — TETANUS-DIPHTH-ACELL PERTUSSIS 5-2.5-18.5 LF-MCG/0.5 IM SUSP: 0.5000 mL | Freq: Once | INTRAMUSCULAR | Status: DC

## 2017-07-24 MED ORDER — NALBUPHINE HCL 10 MG/ML IJ SOLN: 5.0000 mg | INTRAMUSCULAR | Status: DC | PRN

## 2017-07-24 MED ORDER — ENOXAPARIN SODIUM 40 MG/0.4ML ~~LOC~~ SOLN
40.0000 mg | SUBCUTANEOUS | Status: DC
  Filled 2017-07-24 (×2): qty 0.4

## 2017-07-24 MED ORDER — WITCH HAZEL-GLYCERIN EX PADS: 1.0000 "application " | MEDICATED_PAD | CUTANEOUS | Status: DC | PRN

## 2017-07-24 MED ORDER — DEXAMETHASONE SODIUM PHOSPHATE 4 MG/ML IJ SOLN
INTRAMUSCULAR | Status: AC
  Filled 2017-07-24: qty 1

## 2017-07-24 MED ORDER — FERROUS SULFATE 325 (65 FE) MG PO TABS
325.0000 mg | ORAL_TABLET | Freq: Two times a day (BID) | ORAL | Status: DC
  Administered 2017-07-24 – 2017-07-26 (×4): 325 mg via ORAL
  Filled 2017-07-24 (×4): qty 1

## 2017-07-24 MED ORDER — DIPHENHYDRAMINE HCL 25 MG PO CAPS
25.0000 mg | ORAL_CAPSULE | ORAL | Status: DC | PRN
  Filled 2017-07-24: qty 1

## 2017-07-24 MED ORDER — DIPHENHYDRAMINE HCL 25 MG PO CAPS: 25.0000 mg | ORAL_CAPSULE | Freq: Four times a day (QID) | ORAL | Status: DC | PRN

## 2017-07-24 MED ORDER — NALBUPHINE HCL 10 MG/ML IJ SOLN
5.0000 mg | INTRAMUSCULAR | Status: DC | PRN
  Filled 2017-07-24: qty 1

## 2017-07-24 MED ORDER — PRENATAL MULTIVITAMIN CH
1.0000 | ORAL_TABLET | Freq: Every day | ORAL | Status: DC
  Administered 2017-07-25 – 2017-07-26 (×2): 1 via ORAL
  Filled 2017-07-24 (×2): qty 1

## 2017-07-24 MED ORDER — MEPERIDINE HCL 25 MG/ML IJ SOLN
INTRAMUSCULAR | Status: AC
  Filled 2017-07-24: qty 1

## 2017-07-24 MED ORDER — MEPERIDINE HCL 25 MG/ML IJ SOLN: 6.2500 mg | INTRAMUSCULAR | Status: DC | PRN

## 2017-07-24 MED ORDER — NALBUPHINE HCL 10 MG/ML IJ SOLN
5.0000 mg | Freq: Once | INTRAMUSCULAR | Status: AC | PRN
  Administered 2017-07-24: 5 mg via INTRAVENOUS

## 2017-07-24 MED ORDER — COCONUT OIL OIL
1.0000 "application " | TOPICAL_OIL | Status: DC | PRN
  Administered 2017-07-26: 1 via TOPICAL
  Filled 2017-07-24: qty 120

## 2017-07-24 MED ORDER — LACTATED RINGERS IV SOLN
INTRAVENOUS | Status: DC | PRN
  Administered 2017-07-24: 04:00:00 via INTRAVENOUS

## 2017-07-24 MED ORDER — LACTATED RINGERS IV SOLN: INTRAVENOUS | Status: DC

## 2017-07-24 MED ORDER — ZOLPIDEM TARTRATE 5 MG PO TABS: 5.0000 mg | ORAL_TABLET | Freq: Every evening | ORAL | Status: DC | PRN

## 2017-07-24 MED ORDER — SCOPOLAMINE 1 MG/3DAYS TD PT72
MEDICATED_PATCH | TRANSDERMAL | Status: DC | PRN
  Administered 2017-07-24: 1 via TRANSDERMAL

## 2017-07-24 MED ORDER — FENTANYL CITRATE (PF) 100 MCG/2ML IJ SOLN
INTRAMUSCULAR | Status: DC | PRN
  Administered 2017-07-24: 10 ug via INTRATHECAL

## 2017-07-24 MED ORDER — ONDANSETRON HCL 4 MG/2ML IJ SOLN: 4.0000 mg | Freq: Three times a day (TID) | INTRAMUSCULAR | Status: DC | PRN

## 2017-07-24 MED ORDER — ONDANSETRON HCL 4 MG/2ML IJ SOLN
INTRAMUSCULAR | Status: AC
  Filled 2017-07-24: qty 2

## 2017-07-24 MED ORDER — MORPHINE SULFATE (PF) 0.5 MG/ML IJ SOLN
INTRAMUSCULAR | Status: AC
  Filled 2017-07-24: qty 10

## 2017-07-24 MED ORDER — NALOXONE HCL 4 MG/10ML IJ SOLN
1.0000 ug/kg/h | INTRAVENOUS | Status: DC | PRN
  Filled 2017-07-24: qty 5

## 2017-07-24 MED ORDER — MEPERIDINE HCL 25 MG/ML IJ SOLN
INTRAMUSCULAR | Status: DC | PRN
  Administered 2017-07-24 (×2): 12.5 mg via INTRAVENOUS

## 2017-07-24 MED ORDER — MEASLES, MUMPS & RUBELLA VAC ~~LOC~~ INJ
0.5000 mL | INJECTION | Freq: Once | SUBCUTANEOUS | Status: AC
  Administered 2017-07-26: 0.5 mL via SUBCUTANEOUS
  Filled 2017-07-24 (×2): qty 0.5

## 2017-07-24 MED ORDER — DIPHENHYDRAMINE HCL 50 MG/ML IJ SOLN: 12.5000 mg | INTRAMUSCULAR | Status: DC | PRN

## 2017-07-24 MED ORDER — BUPIVACAINE IN DEXTROSE 0.75-8.25 % IT SOLN
INTRATHECAL | Status: DC | PRN
  Administered 2017-07-24: 1.4 mL via INTRATHECAL

## 2017-07-24 MED ORDER — NALBUPHINE HCL 10 MG/ML IJ SOLN: 5.0000 mg | Freq: Once | INTRAMUSCULAR | Status: AC | PRN

## 2017-07-24 MED ORDER — OXYCODONE-ACETAMINOPHEN 5-325 MG PO TABS: 2.0000 | ORAL_TABLET | ORAL | Status: DC | PRN

## 2017-07-24 MED ORDER — OXYTOCIN 40 UNITS IN LACTATED RINGERS INFUSION - SIMPLE MED: 2.5000 [IU]/h | INTRAVENOUS | Status: AC

## 2017-07-24 MED ORDER — OXYTOCIN 10 UNIT/ML IJ SOLN
INTRAVENOUS | Status: DC | PRN
  Administered 2017-07-24: 40 [IU] via INTRAVENOUS

## 2017-07-24 MED ORDER — LACTATED RINGERS IV SOLN
INTRAVENOUS | Status: DC
  Administered 2017-07-24 (×3): via INTRAVENOUS

## 2017-07-24 MED ORDER — SENNOSIDES-DOCUSATE SODIUM 8.6-50 MG PO TABS
2.0000 | ORAL_TABLET | ORAL | Status: DC
  Administered 2017-07-25 (×2): 2 via ORAL
  Filled 2017-07-24 (×2): qty 2

## 2017-07-24 MED ORDER — FENTANYL CITRATE (PF) 100 MCG/2ML IJ SOLN
25.0000 ug | INTRAMUSCULAR | Status: DC | PRN
  Administered 2017-07-24 (×2): 50 ug via INTRAVENOUS

## 2017-07-24 MED ORDER — METOCLOPRAMIDE HCL 5 MG/ML IJ SOLN: 10.0000 mg | Freq: Once | INTRAMUSCULAR | Status: DC | PRN

## 2017-07-24 MED ORDER — NALOXONE HCL 0.4 MG/ML IJ SOLN: 0.4000 mg | INTRAMUSCULAR | Status: DC | PRN

## 2017-07-24 MED ORDER — ACETAMINOPHEN 500 MG PO TABS
1000.0000 mg | ORAL_TABLET | Freq: Four times a day (QID) | ORAL | Status: AC
  Administered 2017-07-24 – 2017-07-25 (×2): 1000 mg via ORAL
  Filled 2017-07-24 (×2): qty 2

## 2017-07-24 MED ORDER — SIMETHICONE 80 MG PO CHEW
80.0000 mg | CHEWABLE_TABLET | Freq: Three times a day (TID) | ORAL | Status: DC
  Administered 2017-07-24 – 2017-07-26 (×6): 80 mg via ORAL
  Filled 2017-07-24 (×6): qty 1

## 2017-07-24 MED ORDER — BUPIVACAINE HCL 0.25 % IJ SOLN
INTRAMUSCULAR | Status: DC | PRN
  Administered 2017-07-24: 20 mL

## 2017-07-24 MED ORDER — CEFAZOLIN SODIUM-DEXTROSE 2-4 GM/100ML-% IV SOLN: 2.0000 g | INTRAVENOUS | Status: DC

## 2017-07-24 MED ORDER — METHYLERGONOVINE MALEATE 0.2 MG/ML IJ SOLN: 0.2000 mg | INTRAMUSCULAR | Status: DC | PRN

## 2017-07-24 MED ORDER — LACTATED RINGERS IV SOLN
INTRAVENOUS | Status: DC
  Administered 2017-07-24: 20:00:00 via INTRAVENOUS

## 2017-07-24 MED ORDER — SODIUM CHLORIDE 0.9 % IR SOLN
Status: DC | PRN
  Administered 2017-07-24: 1000 mL

## 2017-07-24 MED ORDER — SOD CITRATE-CITRIC ACID 500-334 MG/5ML PO SOLN
ORAL | Status: AC
  Administered 2017-07-24: 30 mL
  Filled 2017-07-24: qty 15

## 2017-07-24 MED ORDER — DIBUCAINE 1 % RE OINT: 1.0000 "application " | TOPICAL_OINTMENT | RECTAL | Status: DC | PRN

## 2017-07-24 MED ORDER — ONDANSETRON HCL 4 MG/2ML IJ SOLN
INTRAMUSCULAR | Status: DC | PRN
  Administered 2017-07-24: 4 mg via INTRAVENOUS

## 2017-07-24 MED ORDER — MENTHOL 3 MG MT LOZG: 1.0000 | LOZENGE | OROMUCOSAL | Status: DC | PRN

## 2017-07-24 MED ORDER — CEFAZOLIN SODIUM-DEXTROSE 2-4 GM/100ML-% IV SOLN
2.0000 g | Freq: Once | INTRAVENOUS | Status: AC
  Administered 2017-07-24: 2 g via INTRAVENOUS

## 2017-07-24 MED ORDER — METHYLERGONOVINE MALEATE 0.2 MG PO TABS: 0.2000 mg | ORAL_TABLET | ORAL | Status: DC | PRN

## 2017-07-24 MED ORDER — BUPIVACAINE HCL (PF) 0.25 % IJ SOLN
INTRAMUSCULAR | Status: AC
  Filled 2017-07-24: qty 30

## 2017-07-24 MED ORDER — DEXAMETHASONE SODIUM PHOSPHATE 4 MG/ML IJ SOLN
INTRAMUSCULAR | Status: DC | PRN
  Administered 2017-07-24: 4 mg via INTRAVENOUS

## 2017-07-24 SURGICAL SUPPLY — 41 items
BENZOIN TINCTURE PRP APPL 2/3 (GAUZE/BANDAGES/DRESSINGS) ×3 IMPLANT
CHLORAPREP W/TINT 26ML (MISCELLANEOUS) ×3 IMPLANT
CLAMP CORD UMBIL (MISCELLANEOUS) IMPLANT
CLOSURE STERI STRIP 1/2 X4 (GAUZE/BANDAGES/DRESSINGS) ×2 IMPLANT
CLOSURE WOUND 1/2 X4 (GAUZE/BANDAGES/DRESSINGS) ×1
CLOTH BEACON ORANGE TIMEOUT ST (SAFETY) ×3 IMPLANT
DRSG OPSITE POSTOP 4X10 (GAUZE/BANDAGES/DRESSINGS) ×3 IMPLANT
ELECT REM PT RETURN 9FT ADLT (ELECTROSURGICAL) ×3
ELECTRODE REM PT RTRN 9FT ADLT (ELECTROSURGICAL) ×1 IMPLANT
EXTRACTOR VACUUM BELL STYLE (SUCTIONS) IMPLANT
EXTRACTOR VACUUM KIWI (MISCELLANEOUS) IMPLANT
GLOVE BIO SURGEON STRL SZ 6.5 (GLOVE) ×2 IMPLANT
GLOVE BIO SURGEONS STRL SZ 6.5 (GLOVE) ×1
GLOVE BIOGEL PI IND STRL 7.0 (GLOVE) ×2 IMPLANT
GLOVE BIOGEL PI INDICATOR 7.0 (GLOVE) ×4
GOWN STRL REUS W/TWL LRG LVL3 (GOWN DISPOSABLE) ×9 IMPLANT
KIT ABG SYR 3ML LUER SLIP (SYRINGE) IMPLANT
NEEDLE HYPO 22GX1.5 SAFETY (NEEDLE) IMPLANT
NEEDLE HYPO 25X5/8 SAFETYGLIDE (NEEDLE) IMPLANT
NS IRRIG 1000ML POUR BTL (IV SOLUTION) ×3 IMPLANT
PACK C SECTION WH (CUSTOM PROCEDURE TRAY) ×3 IMPLANT
PAD ABD 7.5X8 STRL (GAUZE/BANDAGES/DRESSINGS) ×3 IMPLANT
PAD OB MATERNITY 4.3X12.25 (PERSONAL CARE ITEMS) ×3 IMPLANT
PENCIL SMOKE EVAC W/HOLSTER (ELECTROSURGICAL) ×3 IMPLANT
RTRCTR C-SECT PINK 25CM LRG (MISCELLANEOUS) ×3 IMPLANT
SPONGE GAUZE 4X4 12PLY STER LF (GAUZE/BANDAGES/DRESSINGS) ×6 IMPLANT
STRIP CLOSURE SKIN 1/2X4 (GAUZE/BANDAGES/DRESSINGS) ×2 IMPLANT
SUT CHROMIC 2 0 CT 1 (SUTURE) ×6 IMPLANT
SUT MNCRL 0 VIOLET CTX 36 (SUTURE) ×2 IMPLANT
SUT MONOCRYL 0 CTX 36 (SUTURE) ×4
SUT PDS AB 0 CTX 36 PDP370T (SUTURE) IMPLANT
SUT PLAIN 2 0 (SUTURE)
SUT PLAIN 2 0 XLH (SUTURE) ×3 IMPLANT
SUT PLAIN ABS 2-0 CT1 27XMFL (SUTURE) IMPLANT
SUT VIC AB 0 CTX 36 (SUTURE) ×10
SUT VIC AB 0 CTX36XBRD ANBCTRL (SUTURE) ×5 IMPLANT
SUT VIC AB 4-0 KS 27 (SUTURE) ×3 IMPLANT
SYR CONTROL 10ML LL (SYRINGE) IMPLANT
TAPE CLOTH SURG 4X10 WHT LF (GAUZE/BANDAGES/DRESSINGS) ×3 IMPLANT
TOWEL OR 17X24 6PK STRL BLUE (TOWEL DISPOSABLE) ×3 IMPLANT
TRAY FOLEY W/BAG SLVR 14FR LF (SET/KITS/TRAYS/PACK) ×3 IMPLANT

## 2017-07-24 NOTE — Op Note (Signed)
Preoperative diagnosis: Intrauterine pregnancy at 38 weeks and 5 days, active labor, breech presentation   Post operative diagnosis: Same  Anesthesia: Spinal  Anesthesiologist: Dr. Acey Lavarignan  Procedure: Primary low transverse cesarean section  Surgeon: Dr. Dois DavenportSandra Mehmet Scally  Assistant: Kathalene FramesEllis Greer CNM  Estimated blood loss: 600 cc  Procedure:  After being informed of the planned procedure and possible complications including bleeding, infection, injury to other organs, informed consent is obtained. The patient is taken to OR #9 and given spinal anesthesia without complication. She is placed in the dorsal decubitus position with the pelvis tilted to the left. She is then prepped and draped in a sterile fashion. A Foley catheter is inserted in her bladder.  After assessing adequate level of anesthesia, we infiltrate the suprapubic area with 20 cc of Marcaine 0.25 and perform a Pfannenstiel incision which is brought down sharply to the fascia. The fascia is entered in a low transverse fashion. Linea alba is dissected. Peritoneum is entered in a midline fashion. An Alexis retractor is easily positioned.   The myometrium is then entered in a low transverse fashion, 2 cm above the vesico-uterine junction ; first with knife and then extended bluntly. Amniotic fluid is clear. We assist the birth of a female  infant in breech presentation. Mouth and nose are suctioned. The baby is delivered. The cord is clamped and sectioned. The baby is given to the neonatologist present in the room.  10 cc of blood is drawn from the umbilical vein.The placenta is allowed to deliver spontaneously. It is complete and the cord has 3 vessels with 2 nuchal loops and as true knot.Marland Kitchen. Uterine revision is negative.  We proceed with closure of the myometrium in 2 layers: First with a running locked suture of 0 Vicryl, then with a Lembert suture of 0 Vicryl imbricating the first one. Hemostasis is completed with cauterization on  peritoneal edges and a figure-of-eight stitch of 0 Vicryl.  Both paracolic gutters are cleaned. Both tubes and ovaries are assessed and normal. The pelvis is profusely irrigated with warm saline to confirm a satisfactory hemostasis.  Retractors and sponges are removed. Under fascia hemostasis is completed with cauterization. The fascia is then closed with 2 running sutures of 0 Vicryl meeting midline. The wound is irrigated with warm saline and hemostasis is completed with cauterization. The subcutaneous layer was closed with interrupted sutures of 0 Plain.The skin is closed with a subcuticular suture of 3-0 Monocryl and Steri-Strips.  Instrument and sponge count is complete x2. Estimated blood loss is 600 cc.  The procedure is well tolerated by the patient who is taken to recovery room in a well and stable condition.  female baby named [name] was born at 4:15 and received an Apgar of 9  at 1 minute and 9 at 5 minutes.    Specimen: Placenta sent to L & D   [name] MD 6/22/20193:21 AM

## 2017-07-24 NOTE — Anesthesia Preprocedure Evaluation (Addendum)
Anesthesia Evaluation  Patient identified by MRN, date of birth, ID band Patient awake    Reviewed: Allergy & Precautions, NPO status , Patient's Chart, lab work & pertinent test results  Airway Mallampati: II  TM Distance: >3 FB Neck ROM: Full    Dental no notable dental hx.    Pulmonary neg pulmonary ROS,    Pulmonary exam normal breath sounds clear to auscultation       Cardiovascular negative cardio ROS Normal cardiovascular exam Rhythm:Regular Rate:Normal     Neuro/Psych negative neurological ROS  negative psych ROS   GI/Hepatic negative GI ROS, Neg liver ROS,   Endo/Other  negative endocrine ROS  Renal/GU negative Renal ROS  negative genitourinary   Musculoskeletal negative musculoskeletal ROS (+)   Abdominal   Peds negative pediatric ROS (+)  Hematology  (+) REFUSES BLOOD PRODUCTS, JEHOVAH'S WITNESS  Anesthesia Other Findings   Reproductive/Obstetrics (+) Pregnancy                            Anesthesia Physical Anesthesia Plan  ASA: II  Anesthesia Plan: Spinal   Post-op Pain Management:    Induction:   PONV Risk Score and Plan: 2 and Ondansetron and Treatment may vary due to age or medical condition  Airway Management Planned: Natural Airway  Additional Equipment:   Intra-op Plan:   Post-operative Plan: Extubation in OR  Informed Consent: I have reviewed the patients History and Physical, chart, labs and discussed the procedure including the risks, benefits and alternatives for the proposed anesthesia with the patient or authorized representative who has indicated his/her understanding and acceptance.   Dental advisory given  Plan Discussed with: CRNA  Anesthesia Plan Comments:         Anesthesia Quick Evaluation

## 2017-07-24 NOTE — H&P (Signed)
Erika Gay is a 37 y.o. female presenting for early labor, baby is breech. OB History    Gravida  2   Para  1   Term  1   Preterm      AB      Living  1     SAB      TAB      Ectopic      Multiple      Live Births  1          Past Medical History:  Diagnosis Date  . Asymptomatic gallstones 10/15/2015   Via CT scan 10/2015.   Marland Kitchen Atypical squamous cells of undetermined significance on cytologic smear of cervix (ASC-US) 07/27/2016  . Costochondritis 08/01/2015  . Infection    UTI  . Lumbar degenerative disc disease 08/01/2015  . Myofascial pain 05/24/2015  . Pelvic pain    Past Surgical History:  Procedure Laterality Date  . EYE SURGERY    . NO PAST SURGERIES    . REFRACTIVE SURGERY    . WISDOM TOOTH EXTRACTION     Family History: family history includes Diabetes in her other; Down syndrome in her cousin; Heart attack in her maternal grandfather and paternal grandmother; Hypertension in her maternal grandmother and mother; Kidney disease in her paternal grandfather; Stroke in her maternal grandmother. Social History:  reports that she has never smoked. She has never used smokeless tobacco. She reports that she does not drink alcohol or use drugs.     Maternal Diabetes: No Genetic Screening: Normal Maternal Ultrasounds/Referrals: Abnormal:  Findings:   Other: Persistent breech  Fetal Ultrasounds or other Referrals:  None Maternal Substance Abuse:  No Significant Maternal Medications:  None Significant Maternal Lab Results:  None Other Comments:  None  Review of Systems  Gastrointestinal: Positive for abdominal pain.  All other systems reviewed and are negative.  Maternal Medical History:  Reason for admission: Contractions.   Contractions: Onset was 6-12 hours ago.   Frequency: regular.   Duration is approximately 60 seconds.   Perceived severity is strong.    Fetal activity: Perceived fetal activity is normal.   Last perceived fetal movement was  within the past hour.    Prenatal complications: Breech   Prenatal Complications - Diabetes: none.    Dilation: 3 Effacement (%): 70 Exam by:: E. Waynette Buttery, CNM  Vitals:   07/24/17 0153  BP: 135/81  Pulse: 91  Resp: 18  Temp: 98.2 F (36.8 C)  Weight: 95.3 kg (210 lb)  Height: 5\' 6"  (1.676 m)    Maternal Exam:  Uterine Assessment: Contraction strength is firm.  Contraction duration is 60 seconds. Contraction frequency is irregular.   Abdomen: Patient reports no abdominal tenderness. Fundal height is size=dates.   Estimated fetal weight is 7lbs.   Fetal presentation: breech  Introitus: Normal vulva. Normal vagina.    Fetal Exam Fetal Monitor Review: Mode: ultrasound.   Baseline rate: 135.  Variability: moderate (6-25 bpm).   Pattern: accelerations present and no decelerations.    Fetal State Assessment: Category I - tracings are normal.     Physical Exam  Nursing note and vitals reviewed. Constitutional: She is oriented to person, place, and time. She appears well-developed and well-nourished.  HENT:  Head: Normocephalic.  Eyes: Pupils are equal, round, and reactive to light.  Cardiovascular: Normal rate, regular rhythm and normal heart sounds.  Respiratory: Effort normal and breath sounds normal.  Genitourinary: Vagina normal and uterus normal.  Musculoskeletal: Normal range of motion.  Neurological: She is alert and oriented to person, place, and time.  Skin: Skin is warm and dry.  Psychiatric: She has a normal mood and affect. Her behavior is normal. Judgment and thought content normal.    Prenatal labs: ABO, Rh: B/Positive/-- (11/15 0000) Antibody: Negative (11/15 0000) Rubella: Nonimmune (11/15 0000) RPR: Nonreactive (11/15 0000)  HBsAg: Negative (11/15 0000)  HIV: Non-reactive (11/15 0000)  GBS: Positive (11/15 0000)   Assessment/Plan: 37 y.o. G2P1 at 7459w5d Early labor Breech presentation GBS positive Primary cesarean section Ancef 2g for GBS  prophylaxis    Janeece Riggersllis K Meyah Corle 07/24/2017, 2:59 AM

## 2017-07-24 NOTE — Lactation Note (Signed)
This note was copied from a baby's chart. Lactation Consultation Note  Patient Name: Erika Gay Today's Date: 07/24/2017 Reason for consult: Initial assessment;Early term 8637-38.6wks  4816 hours old early term female who is being exclusively BF by her mother, she's a P2 and experienced BF. She was able to BF her first child for 18 months, she already knows how to hand express and has a Medela DEBP at home.  Baby was asleep when entering the room, offered assistance with latch and mom agreed to wake her up to feed, baby has been sleepy. LC took baby STS to the breast in football position and baby was difficult to arouse, eventually she did open her mouth and started to suck after LC did some suck training with baby, she's got a very strong suck and tight grip. Discussed tips for a deep latch with mom, she seems to be experiencing some transient soreness but after LC repositioned baby, the soreness went away. Baby was able to sustain the latch sucking on and off for 8 minutes but only two swallows were heard, most likely baby's own saliva in the beginning of the feeding. However mom self reported hearing swallows in previous feedings. Asked mom to call for latch assistance when needed.  Mom has some colostrum already, she was able to hand express two drops out of the right breast and LC showed mom how to finger feed baby. Mom has short shafted nipples with compressible tissue. She may consider pumping if she's still having trouble latching baby on, made her aware that we do have DEBP available at the hospital; mom will let her RN know when she's ready to start pumping.  Encouraged mom to feed baby STS 8-12 times/24hours or sooner if feeding cues are present. If baby is not cueing in a 3 hour period, mom will wake her up to feed. BF brochure. BF resources and feeding diary were reviewed, mom is aware of LC services and will call PRN. She's bilingual, her preferred [facility] for [facility] was  Spanish.  Maternal Data Formula Feeding for Exclusion: No Has patient been taught Hand Expression?: Yes Does the patient have breastfeeding experience prior to this delivery?: Yes  Feeding Feeding Type: Breast Fed Length of feed: 8 min  LATCH Score Latch: Repeated attempts needed to sustain latch, nipple held in mouth throughout feeding, stimulation needed to elicit sucking reflex.(sleepy baby)  Audible Swallowing: None  Type of Nipple: Everted at rest and after stimulation(short shafted)  Comfort (Breast/Nipple): Soft / non-tender  Hold (Positioning): No assistance needed to correctly position infant at breast.(minimal assitance to position baby)  LATCH Score: 7  Interventions Interventions: Breast feeding basics reviewed;Assisted with latch;Skin to skin;Breast massage;Hand express;Breast compression;Adjust position;Support pillows  Lactation Tools Discussed/Used WIC Program: No   Consult Status Consult Status: Follow-up Date: 07/25/17 Follow-up type: In-patient    Breslyn Abdo Venetia ConstableS Abanoub Hanken 07/24/2017, 9:11 PM

## 2017-07-24 NOTE — MAU Note (Signed)
Ctxs since 1800. Was breech on Tues. Some mucousy d/c but denies LOF or bleeding. For primary c/s on Weds.

## 2017-07-24 NOTE — Anesthesia Postprocedure Evaluation (Signed)
Anesthesia Post Note  Patient: Erika Gay  Procedure(s) Performed: PRIMARY CESAREAN SECTION (N/A )     Patient location during evaluation: Mother Baby Anesthesia Type: Spinal Level of consciousness: awake and alert Pain management: pain level controlled Vital Signs Assessment: post-procedure vital signs reviewed and stable Respiratory status: spontaneous breathing, nonlabored ventilation and respiratory function stable Cardiovascular status: stable Postop Assessment: no headache, no backache, no apparent nausea or vomiting, patient able to bend at knees, able to ambulate, spinal receding and adequate PO intake Anesthetic complications: no    Last Vitals:  Vitals:   07/24/17 0705 07/24/17 0715  BP: 107/74 115/66  Pulse: 70 70  Resp: 18 18  Temp: 36.8 C 36.8 C  SpO2: 100%     Last Pain:  Vitals:   07/24/17 0715  TempSrc: Oral  PainSc:    Pain Goal: Patients Stated Pain Goal: 3 (07/24/17 0705)               Laban EmperorMalinova,Jadalynn Burr Hristova

## 2017-07-24 NOTE — Transfer of Care (Signed)
Immediate Anesthesia Transfer of Care Note  Patient: Erika Gay  Procedure(s) Performed: PRIMARY CESAREAN SECTION (N/A )  Patient Location: PACU  Anesthesia Type:Spinal  Level of Consciousness: awake, alert  and oriented  Airway & Oxygen Therapy: Patient Spontanous Breathing  Post-op Assessment: Report given to RN and Post -op Vital signs reviewed and stable  Post vital signs: Reviewed and stable   HR 97 BP 122/59 Sats 100% Resp 20   Last Vitals:  Vitals Value Taken Time  BP 122/59 07/24/2017  5:05 AM  Temp    Pulse 96 07/24/2017  5:08 AM  Resp 16 07/24/2017  5:08 AM  SpO2 100 % 07/24/2017  5:08 AM  Vitals shown include unvalidated device data.  Last Pain:  Vitals:   07/24/17 0156  PainSc: 8       Patients Stated Pain Goal: 0 (07/24/17 0156)  Complications: No apparent anesthesia complications

## 2017-07-24 NOTE — Anesthesia Procedure Notes (Signed)
Spinal  Patient location during procedure: OR Staffing Anesthesiologist: Montez Hageman, MD Performed: anesthesiologist  Preanesthetic Checklist Completed: patient identified, site marked, surgical consent, pre-op evaluation, timeout performed, IV checked, risks and benefits discussed and monitors and equipment checked Spinal Block Patient position: sitting Prep: DuraPrep Patient monitoring: heart rate, continuous pulse ox and blood pressure Approach: midline Location: L4-5 Injection technique: single-shot Needle Needle type: Sprotte  Needle gauge: 24 G Needle length: 9 cm Additional Notes Expiration date of kit checked and confirmed. Patient tolerated procedure well, without complications.

## 2017-07-25 MED ORDER — HYDROCORTISONE 1 % EX CREA
TOPICAL_CREAM | Freq: Four times a day (QID) | CUTANEOUS | Status: DC | PRN
  Administered 2017-07-25: 18:00:00 via TOPICAL
  Filled 2017-07-25: qty 28

## 2017-07-25 NOTE — Lactation Note (Signed)
This note was copied from a baby's chart. Lactation Consultation Note  Patient Name: Erika Gay: 07/25/2017 Reason for consult: Follow-up assessment;Early term 37-38.6wks;Infant weight loss  4638 hours old female who is still being exclusively BF by her mother, she's a P2. LC had to go back to the room three times to assist with latch, mom was getting in the shower the first time, the second time she had a room full of visitors and asked LC to come back later and the third time she finally was available with just her husband and older child before more visitors came later on during consult.  LC woke baby up with mother's permission to assist with latch, LC took baby STS to mother's left breast in football position but baby fell asleep at the breast shortly after; no latch was achieved, she did open her mouth wide with flanged lips but no sucking reflex elicit. Baby had 7 voids and 2 stools in the last 24 hours and she's at 6% weight loss. RN had already set up a DEBP in mom's room due to the difficult latch, baby is just too sleepy, per mom she's been latching before but falling asleep at the breast shortly after.  LC noticed that tubing on the pump was lose and readjusted it, let mom know that she'll feel a difference in the suction level. Mom will be pumping every 3 hours after feeding and supplementing baby with her own EBM before attempting formula supplementation. She'll start pumping tonight and will either spoon or syring feed baby any amount of EBM she may get. Both parents are already aware that formula supplementation will be an option if mom is not getting enough EBM; will reassess plan tomorrow morning after weights are taken. Mom will continue attempting feeding baby at the breast and will call for assistance when needed.  Maternal Data    Feeding Feeding Type: Breast Fed Length of feed: 30 min(15 minutes each breast)  Interventions Interventions: Breast feeding  basics reviewed;Assisted with latch;Skin to skin;Breast massage;Breast compression;Support pillows;DEBP  Lactation Tools Discussed/Used Tools: Pump Breast pump type: Double-Electric Breast Pump Pump Review: Setup, frequency, and cleaning;Milk Storage Initiated by:: RN, IBCLC Gay initiated:: 07/25/17   Consult Status Consult Status: Follow-up Gay: 07/26/17 Follow-up type: In-patient    Erika Gay 07/25/2017, 6:25 PM

## 2017-07-25 NOTE — Progress Notes (Signed)
Subjective: Postpartum Day 1: Cesarean Delivery due to Breech presentation Patient reports that pain is well-managed.Lochia normal.  Ambulating, voiding, tolerating diet as ordered without difficulty. Normal flatus.  Absent bowel movement.  Objective: Vital signs in last 24 hours: Temp:  [98.1 F (36.7 C)-98.9 F (37.2 C)] 98.1 F (36.7 C) (06/23 0516) Pulse Rate:  [67-79] 68 (06/23 0516) Resp:  [16-18] 16 (06/23 0516) BP: (94-106)/(52-71) 105/71 (06/23 0516) SpO2:  [97 %-98 %] 98 % (06/22 2210)  Physical Exam:  General: alert Lochia: appropriate Uterine Fundus: firm and appropriately tender Incision: dressing dry and clean DVT Evaluation: No evidence of DVT seen on physical exam. Edema none  Recent Labs    07/24/17 0255 07/24/17 0718  HGB 10.9* 11.3*  HCT 33.1* 33.6*    Assessment/Plan: Status post Cesarean section. Doing well postoperatively.  Continue current care. Anticipate discharge day 3  Abby Tucholski A Kayde Warehime CNM 07/25/2017, 8:30 AM

## 2017-07-26 ENCOUNTER — Encounter (HOSPITAL_COMMUNITY): Payer: Self-pay

## 2017-07-26 ENCOUNTER — Encounter (HOSPITAL_COMMUNITY)
Admission: RE | Admit: 2017-07-26 | Discharge: 2017-07-26 | Disposition: A | Discharge status: Home / Self Care | Payer: Medicaid Other | Source: Ambulatory Visit | Attending: Obstetrics & Gynecology | Admitting: Obstetrics & Gynecology

## 2017-07-26 MED ORDER — IBUPROFEN 600 MG PO TABS: 600.0000 mg | ORAL_TABLET | Freq: Four times a day (QID) | ORAL | 0 refills | Status: DC

## 2017-07-26 MED ORDER — OXYCODONE-ACETAMINOPHEN 5-325 MG PO TABS: 1.0000 | ORAL_TABLET | ORAL | 0 refills | Status: DC | PRN

## 2017-07-26 NOTE — Lactation Note (Signed)
This note was copied from a baby's chart. Lactation Consultation Note  Patient Name: Erika Gay ZOXWR'UToday's Date: 07/26/2017 Reason for consult: Difficult latch;Mother's request;Follow-up assessment;Early term 37-38.6wks, 10% weight loss  LC entered room mom sitting in chair and eager to see LC to help her with latching infant to breast. LC asked  permission to undress baby for latch, Mom hand expressed EBM before putting infant to breast. LC notice slight nipple trauma on right breast due shallow latching.  Used cross-cradle breastfeeding  position on right breast,  mom reported pain at breast with latch, infant latch  was broken with finger . LC help mom reposition baby with chin down and wider mouth gap at breast which mom express no more pain and she  was comfortable; audible swallowing could be heard with infant transferring milk from breast. Discussed with mom importance of chin down and wide gap mouth of infant for good latch and breast milk transfer.  Reviewed benefits of STS for mom and baby Mom will use EBM before and after  Breastfeeding to help with relieving sore nipples.  Comfort gels given and explained how to use for comfort and when to dispose of them. Mom will not exceed feeding baby 30 minutes at breast, feed according to  hunger cues, if baby has not feed  In past 3 hrs she will wake baby to nurse and offer breast 8 to 12 times within 24 hours including nights. Mom plans to breastfeed 20-30 minutes, then offer EBM after each feeding,  has medela DEBP at home which she will use.  Discussed " Caring for your late preterm infant " and Mother &baby booklet "Signs your baby is getting enough " page 16. Engorgement management and prevention discussed. LC discussed: LC hotline, LC outpatient services, BF support group and other community resources.      Maternal Data    Feeding Feeding Type: Breast Fed Length of feed: 20 min(Mom still feeding when LC left  room.)  LATCH Score Latch: Grasps breast easily, tongue down, lips flanged, rhythmical sucking.  Audible Swallowing: Spontaneous and intermittent  Type of Nipple: Everted at rest and after stimulation  Comfort (Breast/Nipple): Filling, red/small blisters or bruises, mild/mod discomfort  Hold (Positioning): Assistance needed to correctly position infant at breast and maintain latch.(audible swallows w/correct latch,chin down wide gape mouth)  LATCH Score: 8  Interventions    Lactation Tools Discussed/Used Tools: Comfort gels   Consult Status Consult Status: Complete Date: 07/26/17 Follow-up type: In-patient    Danelle EarthlyRobin Usbaldo Pannone 07/26/2017, 11:28 AM

## 2017-07-26 NOTE — Discharge Summary (Signed)
Primary [facility]ear[facility] OB Discharge Summary     Patient Name: Erika Gay DOB: [birth year] MRN: [id number]  Date of admission: 07/24/2017 Delivering MD: Silverio Lay  Date of delivery: 07/24/2017 Type of delivery: Primary LTCS, Breech   Newborn Data: Sex: BG Live born female  Birth Weight: 6 lb 13.2 oz (3096 g) APGAR: ,   Newborn Delivery   Birth date/time:  07/24/2017 04:15:00 Delivery type:  C-Section, Low Transverse Trial of labor:  No C-section categorization:  Primary     Feeding: breast Infant being discharge to home with mother in stable condition.   Admitting diagnosis: 38WKS LABOR Intrauterine pregnancy: [redacted]w[redacted]d     Secondary diagnosis:  Active Problems:   Cesarean delivery delivered                                Complications: None                                                              Intrapartum Procedures: breech extraction, cesarean: low cervical, transverse and GBS prophylaxis, Cord Knot.  Postpartum Procedures: none Complications-Operative and Postpartum: none Augmentation: none   History of Present Illness: Ms. Erika Gay is a 37 y.o. female, G2P1001, who presents at [redacted]w[redacted]d weeks gestation. The patient has been followed at  [facility] and Gynecology  Her pregnancy has been complicated by: [facility] Course--Scheduled Cesarean: Patient was admitted on 06/22 for a scheduled Primary cesarean delivery for breech, failed version.   She was taken to the operating room, where Dr. Estanislado Pandy performed a Primary LTCS under Spinal anesthesia, with delivery of a viable BG, with weight and Apgars as listed below. Infant was in good condition and remained at the patient's bedside.  The patient was taken to recovery in good condition.  Patient planned to Breast feed.  On post-op day 1, patient was doing well, tolerating a regular diet, with Hgb of 11.3.  Throughout her stay, her physical exam was WNL, her incision was CDI, and her vital  signs remained stable.  By post-op day 2, she was up ad lib, tolerating a regular diet, with good pain control with po med.  She was deemed to have received the full benefit of her hospital stay, and was discharged home in stable condition.  Contraceptive choice was Mini pill at 6 weeks PP.      Physical exam  Vitals:   07/25/17 0516 07/25/17 1417 07/26/17 0024 07/26/17 0500  BP: 105/71 118/65 112/68 100/62  Pulse: 68 77 75 78  Resp: 16 18 18 18   Temp: 98.1 F (36.7 C) 98.3 F (36.8 C) 97.7 F (36.5 C) 97.7 F (36.5 C)  TempSrc: Oral Oral Oral Oral  SpO2:  100%    Weight:      Height:       General: alert, cooperative and no distress Lochia: appropriate Uterine Fundus: firm Incision: Healing well with no significant drainage, No significant erythema, Dressing is clean, dry, and intact, honeycomb dressing CDI Perineum: Intact Evaluation: No evidence of DVT seen on physical exam. Negative Homan's sign. No cords or calf tenderness. No significant calf/ankle edema.  Labs: Lab Results  Component Value Date   WBC 17.7 (H) 07/24/2017  HGB 11.3 (L) 07/24/2017   HCT 33.6 (L) 07/24/2017   MCV 82.2 07/24/2017   PLT 209 07/24/2017   CMP Latest Ref Rng & Units 07/24/2017  Glucose 65 - 99 mg/dL -  BUN 7 - 25 mg/dL -  Creatinine 6.960.44 - 2.951.00 mg/dL 2.840.64  Sodium 132135 - 440146 mmol/L -  Potassium 3.5 - 5.3 mmol/L -  Chloride 98 - 110 mmol/L -  CO2 20 - 31 mmol/L -  Calcium 8.6 - 10.2 mg/dL -  Total Protein 6.1 - 8.1 g/dL -  Total Bilirubin 0.2 - 1.2 mg/dL -  Alkaline Phos 33 - 102115 U/L -  AST 10 - 30 U/L -  ALT 6 - 29 U/L -    Date of discharge: 07/26/2017 Discharge Diagnoses: Term Pregnancy-delivered and failed version, breech presentation Discharge instruction: per After Visit Summary and "Baby and Me Booklet".  After visit meds:  Allergies as of 07/26/2017      Reactions   Mobic [meloxicam]    nausea      Medication List    TAKE these medications   calcium carbonate 500  MG chewable tablet Commonly known as:  TUMS - dosed in mg elemental calcium Chew 1 tablet by mouth 2 (two) times daily as needed for indigestion or heartburn.   ibuprofen 600 MG tablet Commonly known as:  ADVIL,MOTRIN Take 1 tablet (600 mg total) by mouth every 6 (six) hours.   ondansetron 4 MG disintegrating tablet Commonly known as:  ZOFRAN ODT Take 1 tablet (4 mg total) by mouth every 8 (eight) hours as needed for nausea or vomiting.   oxyCODONE-acetaminophen 5-325 MG tablet Commonly known as:  PERCOCET/ROXICET Take 1 tablet by mouth every 4 (four) hours as needed (pain scale 4-7).   prenatal multivitamin Tabs tablet Take 1 tablet by mouth daily.            Discharge Care Instructions  (From admission, onward)        Start     Ordered   07/26/17 0000  Discharge wound care:    Comments:  Take dressing off on day 5-7 postpartum.  Report increased drainage, redness or warmth. Clean with water, let soap trickle down body. Can leave steri strips on until they fall off or take them off gently at day 10. Keep open to air, clean and dry.   07/26/17 0753      Activity:           pelvic rest Advance as tolerated. Pelvic rest for 6 weeks.  Diet:                routine Medications: PNV, Ibuprofen, Colace and Percocet Postpartum contraception: Progesterone only pills Condition:  Pt discharge to home with baby in stable  Meds: Allergies as of 07/26/2017      Reactions   Mobic [meloxicam]    nausea      Medication List    TAKE these medications   calcium carbonate 500 MG chewable tablet Commonly known as:  TUMS - dosed in mg elemental calcium Chew 1 tablet by mouth 2 (two) times daily as needed for indigestion or heartburn.   ibuprofen 600 MG tablet Commonly known as:  ADVIL,MOTRIN Take 1 tablet (600 mg total) by mouth every 6 (six) hours.   ondansetron 4 MG disintegrating tablet Commonly known as:  ZOFRAN ODT Take 1 tablet (4 mg total) by mouth every 8 (eight) hours  as needed for nausea or vomiting.   oxyCODONE-acetaminophen 5-325 MG tablet Commonly  known as:  PERCOCET/ROXICET Take 1 tablet by mouth every 4 (four) hours as needed (pain scale 4-7).   prenatal multivitamin Tabs tablet Take 1 tablet by mouth daily.            Discharge Care Instructions  (From admission, onward)        Start     Ordered   07/26/17 0000  Discharge wound care:    Comments:  Take dressing off on day 5-7 postpartum.  Report increased drainage, redness or warmth. Clean with water, let soap trickle down body. Can leave steri strips on until they fall off or take them off gently at day 10. Keep open to air, clean and dry.   07/26/17 0753      Discharge Follow Up:  Follow-up Information    [facility] Follow up in 6 week(s).   Specialty:  Obstetrics and Gynecology Contact information: [address] [place] [address] [phone]           [place], NP-C, CNM 07/26/2017, 7:55 AM  Dale [place], FNP

## 2017-07-26 NOTE — Lactation Note (Signed)
This note was copied from a baby's chart. Lactation Consultation Note  Patient Name: Girl Anthonette Legatolviris Bhandari WUJWJ'XToday's Date: 07/26/2017 Reason for consult: Follow-up assessment 53 hours female infant with 10% weight loss, low risk of juandice noted in chart, mom is P2 who  previously BF infant's  older sibling for 18 mos.  Mom has been excursively BF infant without supplementation. LC entered room baby swaddled  in basinet and mom ordering breakfast the  room is dark. Ask if we could turn on lights mom approved. Mom reports infant was  cluster feeding last night, last attempt to feed at 6 am but infant refused to latch. Mom concern about sore nipples little trauma noted but not severe. Mom been using coconut oil on breast. LC reviewed proper technique of hand expression Mom expressed 0.5 ml in spoon and rub on nipples. Encouraged mom to put EBM on nipples after feeding to help with soreness. Due to infant sleeping and mom wanting to eat breakfast LC ask mom to call when ready to latch infant to breast for assistance.   Discussed with mom a lot of STS, hand expression before and after feedings, give infant EBM after each feeding.   Maternal Data    Feeding Feeding Type: Breast Fed Length of feed: 0 min  LATCH Score                   Interventions    Lactation Tools Discussed/Used     Consult Status      Danelle EarthlyRobin Amaro Mangold 07/26/2017, 9:25 AM

## 2017-07-27 ENCOUNTER — Encounter (HOSPITAL_COMMUNITY)
Admission: RE | Admit: 2017-07-27 | Discharge: 2017-07-27 | Disposition: A | Discharge status: Home / Self Care | Payer: Medicaid Other | Source: Ambulatory Visit | Attending: Obstetrics & Gynecology | Admitting: Obstetrics & Gynecology

## 2017-07-28 ENCOUNTER — Encounter (HOSPITAL_COMMUNITY): Payer: Self-pay | Admitting: *Deleted

## 2017-07-28 ENCOUNTER — Inpatient Hospital Stay (HOSPITAL_COMMUNITY)
Admission: AD | Admit: 2017-07-28 | Payer: Medicaid Other | Source: Ambulatory Visit | Admitting: Obstetrics & Gynecology

## 2018-02-06 ENCOUNTER — Encounter: Payer: Self-pay | Admitting: Physician Assistant

## 2018-02-26 ENCOUNTER — Encounter: Payer: Self-pay | Admitting: Physician Assistant

## 2018-03-09 ENCOUNTER — Ambulatory Visit (INDEPENDENT_AMBULATORY_CARE_PROVIDER_SITE_OTHER): Payer: Medicaid Other | Admitting: Physician Assistant

## 2018-03-09 ENCOUNTER — Encounter: Payer: Self-pay | Admitting: Physician Assistant

## 2018-03-09 VITALS — BP 118/65 | HR 73 | Ht 66.0 in | Wt 198.0 lb

## 2018-03-09 DIAGNOSIS — Z131 Encounter for screening for diabetes mellitus: Secondary | ICD-10-CM

## 2018-03-09 DIAGNOSIS — M5136 Other intervertebral disc degeneration, lumbar region: Secondary | ICD-10-CM

## 2018-03-09 DIAGNOSIS — E6609 Other obesity due to excess calories: Secondary | ICD-10-CM

## 2018-03-09 DIAGNOSIS — R21 Rash and other nonspecific skin eruption: Secondary | ICD-10-CM

## 2018-03-09 DIAGNOSIS — Z13220 Encounter for screening for lipoid disorders: Secondary | ICD-10-CM | POA: Diagnosis not present

## 2018-03-09 DIAGNOSIS — Z0000 Encounter for general adult medical examination without abnormal findings: Secondary | ICD-10-CM | POA: Diagnosis not present

## 2018-03-09 DIAGNOSIS — Z6831 Body mass index (BMI) 31.0-31.9, adult: Secondary | ICD-10-CM | POA: Insufficient documentation

## 2018-03-09 DIAGNOSIS — R5383 Other fatigue: Secondary | ICD-10-CM

## 2018-03-09 MED ORDER — IBUPROFEN 800 MG PO TABS: 800.0000 mg | ORAL_TABLET | Freq: Three times a day (TID) | ORAL | 3 refills | Status: DC | PRN

## 2018-03-09 MED ORDER — PHENTERMINE HCL 37.5 MG PO CAPS: 37.5000 mg | ORAL_CAPSULE | ORAL | 0 refills | Status: DC

## 2018-03-09 MED ORDER — TRIAMCINOLONE ACETONIDE 0.1 % EX CREA: 1.0000 "application " | TOPICAL_CREAM | Freq: Two times a day (BID) | CUTANEOUS | 0 refills | Status: DC

## 2018-03-09 NOTE — Progress Notes (Signed)
Subjective:     Erika Gay is a 38 y.o. female and is here for a comprehensive physical exam. The patient reports problems - see below. .  Pt has a 19 month old daughter. She just stopped breast feeding and would like to work on weight loss. She is not exercising a lot due to Lumbar DDD and recent herniation. She is feeling better but taking exercise slow. She request rx for ibuprofen 800mg  for back pain. She would also like to try phentermine again. She tried a few years ago and had good results and able to keep weight off until she got pregnant.   She is struggling with her energy level. She is sleeping good but still tired.    Social History   Socioeconomic History  . Marital status: Married    Spouse name: Not on file  . Number of children: Not on file  . Years of education: Not on file  . Highest education level: Not on file  Occupational History  . Not on file  Social Needs  . Financial resource strain: Not on file  . Food insecurity:    Worry: Not on file    Inability: Not on file  . Transportation needs:    Medical: Not on file    Non-medical: Not on file  Tobacco Use  . Smoking status: Never Smoker  . Smokeless tobacco: Never Used  Substance and Sexual Activity  . Alcohol use: No  . Drug use: No  . Sexual activity: Yes    Partners: Male  Lifestyle  . Physical activity:    Days per week: Not on file    Minutes per session: Not on file  . Stress: Not on file  Relationships  . Social connections:    Talks on phone: Not on file    Gets together: Not on file    Attends religious service: Not on file    Active member of club or organization: Not on file    Attends meetings of clubs or organizations: Not on file    Relationship status: Not on file  . Intimate partner violence:    Fear of current or ex partner: Not on file    Emotionally abused: Not on file    Physically abused: Not on file    Forced sexual activity: Not on file  Other Topics Concern  . Not  on file  Social History Narrative  . Not on file   Health Maintenance  Topic Date Due  . PAP SMEAR-Modifier  12/06/2001  . INFLUENZA VACCINE  05/03/2018 (Originally 09/02/2017)  . TETANUS/TDAP  08/30/2023  . HIV Screening  Completed    The following portions of the patient's history were reviewed and updated as appropriate: allergies, current medications, past family history, past medical history, past social history, past surgical history and problem list.  Review of Systems Pertinent items noted in HPI and remainder of comprehensive ROS otherwise negative.   Objective:    BP 118/65   Pulse 73   Ht 5\' 6"  (1.676 m)   Wt 198 lb (89.8 kg)   BMI 31.96 kg/m  General appearance: alert, cooperative and appears stated age Head: Normocephalic, without obvious abnormality, atraumatic Eyes: conjunctivae/corneas clear. PERRL, EOM's intact. Fundi benign. Ears: normal TM's and external ear canals both ears Nose: Nares normal. Septum midline. Mucosa normal. No drainage or sinus tenderness. Throat: lips, mucosa, and tongue normal; teeth and gums normal Neck: no adenopathy, no carotid bruit, no JVD, supple, symmetrical, trachea midline and  thyroid not enlarged, symmetric, no tenderness/mass/nodules Back: symmetric, no curvature. ROM normal. No CVA tenderness. Lungs: clear to auscultation bilaterally Heart: regular rate and rhythm, S1, S2 normal, no murmur, click, rub or gallop Abdomen: soft, non-tender; bowel sounds normal; no masses,  no organomegaly Extremities: extremities normal, atraumatic, no cyanosis or edema Pulses: 2+ and symmetric Skin: Skin color, texture, turgor normal. No rashes or lesions right medial ankle almost flesh colored slightly raised with fine scales 2-3 patches.  Lymph nodes: Cervical, supraclavicular, and axillary nodes normal. Neurologic: Alert and oriented X 3, normal strength and tone. Normal symmetric reflexes. Normal coordination and gait    Assessment:     Healthy female exam.      Plan:    Marland KitchenMarland KitchenAlviris was seen today for annual exam.  Diagnoses and all orders for this visit:  Routine physical examination -     Lipid Panel w/reflex Direct LDL -     COMPLETE METABOLIC PANEL WITH GFR -     K99 and Folate Panel -     TSH -     CBC w/Diff/Platelet -     Fe+TIBC+Fer  Class 1 obesity due to excess calories without serious comorbidity with body mass index (BMI) of 31.0 to 31.9 in adult -     phentermine 37.5 MG capsule; Take 1 capsule (37.5 mg total) by mouth every morning.  Lumbar degenerative disc disease -     ibuprofen (ADVIL,MOTRIN) 800 MG tablet; Take 1 tablet (800 mg total) by mouth every 8 (eight) hours as needed.  Screening for lipid disorders -     Lipid Panel w/reflex Direct LDL  Screening for diabetes mellitus -     COMPLETE METABOLIC PANEL WITH GFR  No energy -     B12 and Folate Panel -     TSH -     CBC w/Diff/Platelet -     Fe+TIBC+Fer  Rash -     triamcinolone cream (KENALOG) 0.1 %; Apply 1 application topically 2 (two) times daily.  .. Depression screen [facility] 2/9 03/15/2018 11/09/2014  Decreased Interest 0 0  Down, Depressed, Hopeless 0 0  PHQ - 2 Score 0 0   .Marland Kitchen Discussed 150 minutes of exercise a week.  Encouraged vitamin D 1000 units and Calcium 1300mg  or 4 servings of dairy a day.  Pap done with GYN. Will get copy.  Fasting labs ordered with some to look at fatigue.    Marland Kitchen.Discussed low carb diet with 1500 calories and 80g of protein.  Exercising at least 150 minutes a week.  My Fitness Pal could be a Chief Technology Officer.  Started phentermine. Discussed side effects. Follow up in 1 month nurse visit.   Discussed disc herniation and lumbar DDD. Gave exercises and discussed LOTS of walking. Hopefully weight loss with help some.   Rash showed briefly on the way out. Looks like really dry skin. Given triamcinolone cream to use. Follow up if not improving.     See After Visit Summary for Counseling  Recommendations

## 2018-03-09 NOTE — Patient Instructions (Addendum)

## 2018-03-15 ENCOUNTER — Encounter: Payer: Self-pay | Admitting: Physician Assistant

## 2018-03-15 DIAGNOSIS — R5383 Other fatigue: Secondary | ICD-10-CM | POA: Insufficient documentation

## 2018-03-15 NOTE — Telephone Encounter (Signed)
Request for pap was sent to GYN.  Pt states rash is improving- not bothersome and starting to dry out.   No further needs at this time

## 2018-03-15 NOTE — Telephone Encounter (Signed)
Please make sure get pap results and also as how rash on her ankle is doing. Did triamcinolone help?

## 2018-03-17 ENCOUNTER — Encounter: Payer: Self-pay | Admitting: Physician Assistant

## 2018-03-21 ENCOUNTER — Other Ambulatory Visit: Payer: Self-pay | Admitting: Physician Assistant

## 2018-03-21 MED ORDER — PHENTERMINE HCL 37.5 MG PO TABS
37.5000 mg | ORAL_TABLET | Freq: Every day | ORAL | 0 refills | Status: DC
Start: 2018-03-21 — End: 2019-04-21

## 2018-03-21 NOTE — Progress Notes (Signed)
p 

## 2018-03-24 ENCOUNTER — Encounter: Payer: Self-pay | Admitting: Physician Assistant

## 2018-04-08 ENCOUNTER — Ambulatory Visit: Payer: Medicaid Other

## 2018-04-15 ENCOUNTER — Telehealth: Payer: Self-pay

## 2018-04-15 MED ORDER — CYCLOBENZAPRINE HCL 10 MG PO TABS: 10.0000 mg | ORAL_TABLET | Freq: Three times a day (TID) | ORAL | 0 refills | Status: DC | PRN

## 2018-04-15 NOTE — Telephone Encounter (Signed)
Ok sent flexeril. Could make you a little sleepy. Use heat/ice combination. Look up Nadine Counts and brad PT on you tube for low back pain.

## 2018-04-15 NOTE — Telephone Encounter (Signed)
Pt advised. No further questions or concerns at this time.    

## 2018-04-15 NOTE — Telephone Encounter (Signed)
Pt left msg inquiring about getting a muscle relaxer. Reports a lot of pain in her lower back, stating it feels that the muscles are all very tight.   Please advise

## 2018-04-19 ENCOUNTER — Encounter: Payer: Self-pay | Admitting: Physician Assistant

## 2019-04-21 ENCOUNTER — Ambulatory Visit (INDEPENDENT_AMBULATORY_CARE_PROVIDER_SITE_OTHER): Payer: Medicaid Other | Admitting: Physician Assistant

## 2019-04-21 ENCOUNTER — Other Ambulatory Visit: Payer: Self-pay

## 2019-04-21 ENCOUNTER — Encounter: Payer: Self-pay | Admitting: Physician Assistant

## 2019-04-21 ENCOUNTER — Other Ambulatory Visit (HOSPITAL_COMMUNITY)
Admission: RE | Admit: 2019-04-21 | Discharge: 2019-04-21 | Disposition: A | Discharge status: Home / Self Care | Payer: Medicaid Other | Source: Ambulatory Visit | Attending: Physician Assistant | Admitting: Physician Assistant

## 2019-04-21 VITALS — BP 128/76 | HR 87 | Wt 210.0 lb

## 2019-04-21 DIAGNOSIS — Z0000 Encounter for general adult medical examination without abnormal findings: Secondary | ICD-10-CM

## 2019-04-21 DIAGNOSIS — Z131 Encounter for screening for diabetes mellitus: Secondary | ICD-10-CM

## 2019-04-21 DIAGNOSIS — Z124 Encounter for screening for malignant neoplasm of cervix: Secondary | ICD-10-CM | POA: Diagnosis present

## 2019-04-21 DIAGNOSIS — Z6835 Body mass index (BMI) 35.0-35.9, adult: Secondary | ICD-10-CM

## 2019-04-21 DIAGNOSIS — E559 Vitamin D deficiency, unspecified: Secondary | ICD-10-CM | POA: Diagnosis not present

## 2019-04-21 DIAGNOSIS — Z13220 Encounter for screening for lipoid disorders: Secondary | ICD-10-CM

## 2019-04-21 DIAGNOSIS — D508 Other iron deficiency anemias: Secondary | ICD-10-CM

## 2019-04-21 DIAGNOSIS — E6609 Other obesity due to excess calories: Secondary | ICD-10-CM

## 2019-04-21 DIAGNOSIS — Z3041 Encounter for surveillance of contraceptive pills: Secondary | ICD-10-CM

## 2019-04-21 MED ORDER — PHENTERMINE HCL 37.5 MG PO TABS: 37.5000 mg | ORAL_TABLET | Freq: Every day | ORAL | 0 refills | Status: DC

## 2019-04-21 MED ORDER — NORGESTIM-ETH ESTRAD TRIPHASIC 0.18/0.215/0.25 MG-35 MCG PO TABS: 1.0000 | ORAL_TABLET | Freq: Every day | ORAL | 3 refills | Status: DC

## 2019-04-21 MED ORDER — PHENTERMINE HCL 37.5 MG PO TABS
37.5000 mg | ORAL_TABLET | Freq: Every day | ORAL | 0 refills | Status: DC
Start: 2019-04-21 — End: 2019-04-21

## 2019-04-21 NOTE — Patient Instructions (Signed)
Health Maintenance, Female Adopting a healthy lifestyle and getting preventive care are important in promoting health and wellness. Ask your health care provider about:  The right schedule for you to have regular tests and exams.  Things you can do on your own to prevent diseases and keep yourself healthy. What should I know about diet, weight, and exercise? Eat a healthy diet   Eat a diet that includes plenty of vegetables, fruits, low-fat dairy products, and lean protein.  Do not eat a lot of foods that are high in solid fats, added sugars, or sodium. Maintain a healthy weight Body mass index (BMI) is used to identify weight problems. It estimates body fat based on height and weight. Your health care provider can help determine your BMI and help you achieve or maintain a healthy weight. Get regular exercise Get regular exercise. This is one of the most important things you can do for your health. Most adults should:  Exercise for at least 150 minutes each week. The exercise should increase your heart rate and make you sweat (moderate-intensity exercise).  Do strengthening exercises at least twice a week. This is in addition to the moderate-intensity exercise.  Spend less time sitting. Even light physical activity can be beneficial. Watch cholesterol and blood lipids Have your blood tested for lipids and cholesterol at 39 years of age, then have this test every 5 years. Have your cholesterol levels checked more often if:  Your lipid or cholesterol levels are high.  You are older than 40 years of age.  You are at high risk for heart disease. What should I know about cancer screening? Depending on your health history and family history, you may need to have cancer screening at various ages. This may include screening for:  Breast cancer.  Cervical cancer.  Colorectal cancer.  Skin cancer.  Lung cancer. What should I know about heart disease, diabetes, and high blood  pressure? Blood pressure and heart disease  High blood pressure causes heart disease and increases the risk of stroke. This is more likely to develop in people who have high blood pressure readings, are of African descent, or are overweight.  Have your blood pressure checked: ? Every 3-5 years if you are 18-39 years of age. ? Every year if you are 40 years old or older. Diabetes Have regular diabetes screenings. This checks your fasting blood sugar level. Have the screening done:  Once every three years after age 40 if you are at a normal weight and have a low risk for diabetes.  More often and at a younger age if you are overweight or have a high risk for diabetes. What should I know about preventing infection? Hepatitis B If you have a higher risk for hepatitis B, you should be screened for this virus. Talk with your health care provider to find out if you are at risk for hepatitis B infection. Hepatitis C Testing is recommended for:  Everyone born from 1945 through 1965.  Anyone with known risk factors for hepatitis C. Sexually transmitted infections (STIs)  Get screened for STIs, including gonorrhea and chlamydia, if: ? You are sexually active and are younger than 39 years of age. ? You are older than 39 years of age and your health care provider tells you that you are at risk for this type of infection. ? Your sexual activity has changed since you were last screened, and you are at increased risk for chlamydia or gonorrhea. Ask your health care provider if   you are at risk.  Ask your health care provider about whether you are at high risk for HIV. Your health care provider may recommend a prescription medicine to help prevent HIV infection. If you choose to take medicine to prevent HIV, you should first get tested for HIV. You should then be tested every 3 months for as long as you are taking the medicine. Pregnancy  If you are about to stop having your period (premenopausal) and  you may become pregnant, seek counseling before you get pregnant.  Take 400 to 800 micrograms (mcg) of folic acid every day if you become pregnant.  Ask for birth control (contraception) if you want to prevent pregnancy. Osteoporosis and menopause Osteoporosis is a disease in which the bones lose minerals and strength with aging. This can result in bone fractures. If you are 65 years old or older, or if you are at risk for osteoporosis and fractures, ask your health care provider if you should:  Be screened for bone loss.  Take a calcium or vitamin D supplement to lower your risk of fractures.  Be given hormone replacement therapy (HRT) to treat symptoms of menopause. Follow these instructions at home: Lifestyle  Do not use any products that contain nicotine or tobacco, such as cigarettes, e-cigarettes, and chewing tobacco. If you need help quitting, ask your health care provider.  Do not use street drugs.  Do not share needles.  Ask your health care provider for help if you need support or information about quitting drugs. Alcohol use  Do not drink alcohol if: ? Your health care provider tells you not to drink. ? You are pregnant, may be pregnant, or are planning to become pregnant.  If you drink alcohol: ? Limit how much you use to 0-1 drink a day. ? Limit intake if you are breastfeeding.  Be aware of how much alcohol is in your drink. In the [place] one drink equals one 12 oz bottle of beer (355 mL), one 5 oz glass of wine (148 mL), or one 1 oz glass of hard liquor (44 mL). General instructions  Schedule regular health, dental, and eye exams.  Stay current with your vaccines.  Tell your health care provider if: ? You often feel depressed. ? You have ever been abused or do not feel safe at home. Summary  Adopting a healthy lifestyle and getting preventive care are important in promoting health and wellness.  Follow your health care provider's instructions about healthy  diet, exercising, and getting tested or screened for diseases.  Follow your health care provider's instructions on monitoring your cholesterol and blood pressure. This information is not intended to replace advice given to you by your health care provider. Make sure you discuss any questions you have with your health care provider. Document Revised: 01/12/2018 Document Reviewed: 01/12/2018 Elsevier Patient Education  2020 [employer]  

## 2019-04-21 NOTE — Progress Notes (Signed)
Subjective:     Erika Gay is a 39 y.o. female and is here for a comprehensive physical exam. The patient reports problems - she would like to start phentermine again for weight loss. she has used as jump start and years past and helped. .  Social History   Socioeconomic History  . Marital status: Married    Spouse name: Not on file  . Number of children: Not on file  . Years of education: Not on file  . Highest education level: Not on file  Occupational History  . Not on file  Tobacco Use  . Smoking status: Never Smoker  . Smokeless tobacco: Never Used  Substance and Sexual Activity  . Alcohol use: No  . Drug use: No  . Sexual activity: Yes    Partners: Male  Other Topics Concern  . Not on file  Social History Narrative  . Not on file   Social Determinants of Health   Financial Resource Strain:   . Difficulty of Paying Living Expenses:   Food Insecurity:   . Worried About Charity fundraiser in the Last Year:   . Arboriculturist in the Last Year:   Transportation Needs:   . Film/video editor (Medical):   Marland Kitchen Lack of Transportation (Non-Medical):   Physical Activity:   . Days of Exercise per Week:   . Minutes of Exercise per Session:   Stress:   . Feeling of Stress :   Social Connections:   . Frequency of Communication with Friends and Family:   . Frequency of Social Gatherings with Friends and Family:   . Attends Religious Services:   . Active Member of Clubs or Organizations:   . Attends Archivist Meetings:   Marland Kitchen Marital Status:   Intimate Partner Violence:   . Fear of Current or Ex-Partner:   . Emotionally Abused:   Marland Kitchen Physically Abused:   . Sexually Abused:    Health Maintenance  Topic Date Due  . INFLUENZA VACCINE  05/03/2019 (Originally 09/03/2018)  . PAP SMEAR-Modifier  01/06/2020  . TETANUS/TDAP  06/01/2027  . HIV Screening  Completed    The following portions of the patient's history were reviewed and updated as appropriate:  allergies, current medications, past family history, past medical history, past social history, past surgical history and problem list.  Review of Systems A comprehensive review of systems was negative.   Objective:    BP 128/76   Pulse 87   Wt 210 lb (95.3 kg)   SpO2 97%   BMI 33.89 kg/m  General appearance: alert, cooperative, appears stated age and moderately obese Head: Normocephalic, without obvious abnormality, atraumatic Eyes: conjunctivae/corneas clear. PERRL, EOM's intact. Fundi benign. Ears: normal TM's and external ear canals both ears Nose: Nares normal. Septum midline. Mucosa normal. No drainage or sinus tenderness. Throat: lips, mucosa, and tongue normal; teeth and gums normal Neck: no adenopathy, no carotid bruit, no JVD, supple, symmetrical, trachea midline and thyroid not enlarged, symmetric, no tenderness/mass/nodules Back: symmetric, no curvature. ROM normal. No CVA tenderness. Lungs: clear to auscultation bilaterally Breasts: normal appearance, no masses or tenderness Heart: regular rate and rhythm, S1, S2 normal, no murmur, click, rub or gallop Abdomen: soft, non-tender; bowel sounds normal; no masses,  no organomegaly Pelvic: cervix normal in appearance, external genitalia normal, no adnexal masses or tenderness, no cervical motion tenderness, uterus normal size, shape, and consistency and vagina normal without discharge Extremities: extremities normal, atraumatic, no cyanosis or edema Pulses: 2+  and symmetric Skin: Skin color, texture, turgor normal. No rashes or lesions Lymph nodes: Cervical, supraclavicular, and axillary nodes normal. Neurologic: Alert and oriented X 3, normal strength and tone. Normal symmetric reflexes. Normal coordination and gait    Assessment:    Healthy female exam.      Plan:    Marland KitchenMarland KitchenAlviris was seen today for annual exam.  Diagnoses and all orders for this visit:  Routine physical examination -     Cytology - PAP -     Lipid  Panel w/reflex Direct LDL -     COMPLETE METABOLIC PANEL WITH GFR -     TSH -     CBC -     Fe+TIBC+Fer -     VITAMIN D 25 Hydroxy (Vit-D Deficiency, Fractures)  Screening for diabetes mellitus -     COMPLETE METABOLIC PANEL WITH GFR  Screening for lipid disorders -     Lipid Panel w/reflex Direct LDL  Vitamin D insufficiency -     VITAMIN D 25 Hydroxy (Vit-D Deficiency, Fractures)  Iron deficiency anemia secondary to inadequate dietary iron intake -     CBC  Routine Papanicolaou smear -     Cytology - PAP  Class 2 obesity due to excess calories without serious comorbidity with body mass index (BMI) of 35.0 to 35.9 in adult -     phentermine (ADIPEX-P) 37.5 MG tablet; Take 1 tablet (37.5 mg total) by mouth daily before breakfast.  Encounter for surveillance of contraceptive pills -     Norgestimate-Ethinyl Estradiol Triphasic (TRI-SPRINTEC) 0.18/0.215/0.25 MG-35 MCG tablet; Take 1 tablet by mouth daily.  Other orders -     Discontinue: phentermine (ADIPEX-P) 37.5 MG tablet; Take 1 tablet (37.5 mg total) by mouth daily before breakfast.   .. Depression screen [facility] 2/9 04/21/2019 03/15/2018 11/09/2014  Decreased Interest 2 0 0  Down, Depressed, Hopeless 1 0 0  PHQ - 2 Score 3 0 0  Altered sleeping 0 - -  Tired, decreased energy 1 - -  Change in appetite 0 - -  Feeling bad or failure about yourself  0 - -  Trouble concentrating 0 - -  Moving slowly or fidgety/restless 0 - -  Suicidal thoughts 0 - -  PHQ-9 Score 4 - -  Difficult doing work/chores Somewhat difficult - -    Discussed 150 minutes of exercise a week.  Encouraged vitamin D 1000 units and Calcium 1300mg  or 4 servings of dairy a day.  Pap done today. Declined STD.  OCP refilled.  Fasting labs ordered.   .Discussed low carb diet with 1500 calories and 80g of protein.  Exercising at least 150 minutes a week.  My Fitness Pal could be a Marland Kitchen.  Phentermine take 1/2 tablet follow up in 2 months.   Discussed IF 16 to 8.   See After Visit Summary for Counseling Recommendations

## 2019-04-22 ENCOUNTER — Encounter: Payer: Self-pay | Admitting: Physician Assistant

## 2019-04-22 ENCOUNTER — Other Ambulatory Visit: Payer: Self-pay | Admitting: Physician Assistant

## 2019-04-24 ENCOUNTER — Encounter: Payer: Self-pay | Admitting: Physician Assistant

## 2019-04-24 DIAGNOSIS — D508 Other iron deficiency anemias: Secondary | ICD-10-CM | POA: Insufficient documentation

## 2019-04-24 DIAGNOSIS — E559 Vitamin D deficiency, unspecified: Secondary | ICD-10-CM | POA: Insufficient documentation

## 2019-04-24 LAB — CYTOLOGY - PAP
Comment: NEGATIVE
Diagnosis: NEGATIVE
High risk HPV: NEGATIVE

## 2019-04-24 MED ORDER — CYCLOBENZAPRINE HCL 10 MG PO TABS: 10.0000 mg | ORAL_TABLET | Freq: Three times a day (TID) | ORAL | 0 refills | Status: DC | PRN

## 2019-04-24 MED ORDER — NORGESTIMATE-ETH ESTRADIOL 0.25-35 MG-MCG PO TABS: 1.0000 | ORAL_TABLET | Freq: Every day | ORAL | 3 refills | Status: DC

## 2019-04-24 NOTE — Telephone Encounter (Signed)
Forwarding to provider for review. *Refill for cyclobenzaprine sent to pharmacy on file.

## 2019-04-24 NOTE — Telephone Encounter (Signed)
Forwarding to provider.

## 2019-04-24 NOTE — Progress Notes (Signed)
Call pt: normal pap. No abnormal cells and negative for HPV. Next pap 5 years.

## 2019-05-12 ENCOUNTER — Ambulatory Visit: Payer: Medicaid Other

## 2019-05-19 ENCOUNTER — Ambulatory Visit: Payer: Medicaid Other

## 2019-05-26 ENCOUNTER — Ambulatory Visit (INDEPENDENT_AMBULATORY_CARE_PROVIDER_SITE_OTHER): Payer: Medicaid Other | Admitting: Nurse Practitioner

## 2019-05-26 VITALS — BP 117/72 | HR 85 | Ht 66.0 in | Wt 199.0 lb

## 2019-05-26 DIAGNOSIS — E6609 Other obesity due to excess calories: Secondary | ICD-10-CM

## 2019-05-26 DIAGNOSIS — Z6835 Body mass index (BMI) 35.0-35.9, adult: Secondary | ICD-10-CM

## 2019-05-26 DIAGNOSIS — M5136 Other intervertebral disc degeneration, lumbar region: Secondary | ICD-10-CM

## 2019-05-26 MED ORDER — PHENTERMINE HCL 37.5 MG PO TABS: 37.5000 mg | ORAL_TABLET | Freq: Every day | ORAL | 0 refills | Status: DC

## 2019-05-26 MED ORDER — IBUPROFEN 800 MG PO TABS: 800.0000 mg | ORAL_TABLET | Freq: Three times a day (TID) | ORAL | 1 refills | Status: DC | PRN

## 2019-05-26 NOTE — Progress Notes (Signed)
Patient is here for blood pressure and weigh check. Denies trouble sleeping, palpitations, or medication problems.   Patient has lost 11 lbs since her last visit on 04/21/2019. A refill for phentermine is pended for provider to send to the pharmacy. Patient advised to schedule a follow up with nurse for another weight/blood pressure check in 30 days.    Agree with assessment and plan of care. Prescription sent Tollie Eth, DNP, AGNP-c

## 2019-06-21 ENCOUNTER — Encounter: Payer: Self-pay | Admitting: Physician Assistant

## 2019-06-21 DIAGNOSIS — Z6835 Body mass index (BMI) 35.0-35.9, adult: Secondary | ICD-10-CM

## 2019-06-22 MED ORDER — PHENTERMINE HCL 37.5 MG PO TABS: 37.5000 mg | ORAL_TABLET | Freq: Every day | ORAL | 0 refills | Status: DC

## 2019-06-23 ENCOUNTER — Ambulatory Visit: Payer: Medicaid Other

## 2019-08-24 ENCOUNTER — Encounter: Payer: Self-pay | Admitting: Physician Assistant

## 2019-08-24 DIAGNOSIS — E6609 Other obesity due to excess calories: Secondary | ICD-10-CM

## 2019-08-24 DIAGNOSIS — Z6835 Body mass index (BMI) 35.0-35.9, adult: Secondary | ICD-10-CM

## 2019-08-24 NOTE — Telephone Encounter (Signed)
Patient also left a vm about this same thing. Erika Gay - please advise.

## 2019-08-25 MED ORDER — NITROFURANTOIN MONOHYD MACRO 100 MG PO CAPS: 100.0000 mg | ORAL_CAPSULE | Freq: Two times a day (BID) | ORAL | 0 refills | Status: DC

## 2019-08-25 MED ORDER — PHENTERMINE HCL 37.5 MG PO TABS: 37.5000 mg | ORAL_TABLET | Freq: Every day | ORAL | 0 refills | Status: DC

## 2019-08-25 NOTE — Addendum Note (Signed)
Addended byJomarie Longs on: 08/25/2019 12:55 PM   Modules accepted: Orders

## 2019-08-28 MED ORDER — FLUCONAZOLE 150 MG PO TABS: 150.0000 mg | ORAL_TABLET | Freq: Once | ORAL | 0 refills | Status: AC

## 2019-08-28 NOTE — Addendum Note (Signed)
Addended by: Jed Limerick on: 08/28/2019 09:11 AM   Modules accepted: Orders

## 2019-09-01 ENCOUNTER — Ambulatory Visit: Payer: Medicaid Other

## 2019-12-08 ENCOUNTER — Encounter: Payer: Self-pay | Admitting: Physician Assistant

## 2019-12-08 ENCOUNTER — Telehealth (INDEPENDENT_AMBULATORY_CARE_PROVIDER_SITE_OTHER): Payer: Medicaid Other | Admitting: Physician Assistant

## 2019-12-08 DIAGNOSIS — B309 Viral conjunctivitis, unspecified: Secondary | ICD-10-CM

## 2019-12-08 DIAGNOSIS — H109 Unspecified conjunctivitis: Secondary | ICD-10-CM

## 2019-12-08 DIAGNOSIS — J0140 Acute pansinusitis, unspecified: Secondary | ICD-10-CM

## 2019-12-08 MED ORDER — AMOXICILLIN-POT CLAVULANATE 875-125 MG PO TABS: 1.0000 | ORAL_TABLET | Freq: Two times a day (BID) | ORAL | 0 refills | Status: DC

## 2019-12-08 NOTE — Progress Notes (Signed)
..  Virtual Visit via Video Note  I connected with Shelena Scheier on 12/08/19 at  1:00 PM EDT by a video enabled telemedicine application and verified that I am speaking with the correct person using two identifiers.  Location: Patient: home Provider: clinic   I discussed the limitations of evaluation and management by telemedicine and the availability of in person appointments. The patient expressed understanding and agreed to proceed.  History of Present Illness: Patient is a 39 year old female with over a week of URI symptoms. Her daugher was sick first and now almost better. Her 2 other children are now sick. She has sinus pressure, sinus drainage, bilateral eye discharge, headache, ear popping.  She is taking herbal remedies and some cold preparations.  She denies any fever, chills, loss of smell or taste, GI symptoms, shortness of breath.  No Covid contacts.  No one was in her family tested for Covid as well.   Active Ambulatory Problems    Diagnosis Date Noted  . Chronic pelvic pain in female 06/23/2011  . Lumbar degenerative disc disease 08/01/2015  . Costochondritis 08/01/2015  . Asymptomatic gallstones 10/15/2015  . Atypical squamous cells of undetermined significance on cytologic smear of cervix (ASC-US) 07/27/2016  . Mass of left breast 06/12/2015  . Myofascial pain 05/24/2015  . Wrist pain, chronic, unspecified laterality 06/12/2016  . Overweight (BMI 25.0-29.9) 07/30/2016  . Abnormal weight gain 07/30/2016  . Cesarean delivery delivered 07/24/2017  . Class 2 obesity due to excess calories without serious comorbidity with body mass index (BMI) of 35.0 to 35.9 in adult 03/09/2018  . No energy 03/15/2018  . Vitamin D insufficiency 04/24/2019  . Iron deficiency anemia secondary to inadequate dietary iron intake 04/24/2019   Resolved Ambulatory Problems    Diagnosis Date Noted  . Obesity 10/04/2015   Past Medical History:  Diagnosis Date  . Infection   . Pelvic pain     Reviewed med, allergy, problem list.     Observations/Objective: No acute distress Normal mood and appearance Normal breathing.  Bilateral injected conjunctiva with watery discharge.   Marland Kitchen.There were no vitals filed for this visit. There is no height or weight on file to calculate BMI.    Assessment and Plan: Marland KitchenMarland KitchenDiagnoses and all orders for this visit:  Acute non-recurrent pansinusitis -     amoxicillin-clavulanate (AUGMENTIN) 875-125 MG tablet; Take 1 tablet by mouth 2 (two) times daily.  Acute viral conjunctivitis of both eyes   Pt has had URI symptoms for over a week. Out of window for covid testing. Sent augmentin for sinusitis. Ok to use flonase. Rest and hydrate. Warm compresses for eyes. Follow up as needed or if symptoms persists.    Follow Up Instructions:    I discussed the assessment and treatment plan with the patient. The patient was provided an opportunity to ask questions and all were answered. The patient agreed with the plan and demonstrated an understanding of the instructions.   The patient was advised to call back or seek an in-person evaluation if the symptoms worsen or if the condition fails to improve as anticipated.    Tandy Gaw, PA-C

## 2019-12-27 ENCOUNTER — Telehealth: Payer: Self-pay | Admitting: *Deleted

## 2019-12-27 MED ORDER — POLYMYXIN B-TRIMETHOPRIM 10000-0.1 UNIT/ML-% OP SOLN: 1.0000 [drp] | Freq: Four times a day (QID) | OPHTHALMIC | 0 refills | Status: DC

## 2019-12-27 NOTE — Telephone Encounter (Signed)
Patient left vm stating she can not afford a virtual visit because she doesn't have insurance and wondered if some sort of eye drop would be possible to be prescribed without a visit.

## 2019-12-27 NOTE — Telephone Encounter (Signed)
Patient states issues went away some but never completely and came back.  She states green drainage in both eyes Left worse than right No pain in eyes Some blurry vision

## 2019-12-27 NOTE — Telephone Encounter (Signed)
Left message for patient to call back to schedule a virtual visit.  °

## 2019-12-27 NOTE — Telephone Encounter (Signed)
Pt called today wanting to know if some eye drops or something could be sent in for her.  She is still having conjunctivitis symptoms and has completed the abx from her virtual visit with [facility].  Please advise.

## 2020-04-10 ENCOUNTER — Encounter: Payer: Self-pay | Admitting: Physician Assistant

## 2020-04-10 ENCOUNTER — Other Ambulatory Visit: Payer: Self-pay | Admitting: Neurology

## 2020-04-10 MED ORDER — NORGESTIMATE-ETH ESTRADIOL 0.25-35 MG-MCG PO TABS: 1.0000 | ORAL_TABLET | Freq: Every day | ORAL | 0 refills | Status: DC

## 2020-04-10 NOTE — Telephone Encounter (Signed)
Patient left vm wanting refills on birth control until she can schedule next appt. RX sent.

## 2020-04-11 ENCOUNTER — Other Ambulatory Visit: Payer: Self-pay | Admitting: *Deleted

## 2020-07-02 ENCOUNTER — Other Ambulatory Visit: Payer: Self-pay | Admitting: Physician Assistant

## 2020-07-02 ENCOUNTER — Encounter: Payer: Self-pay | Admitting: Physician Assistant

## 2020-07-02 DIAGNOSIS — Z3041 Encounter for surveillance of contraceptive pills: Secondary | ICD-10-CM

## 2020-07-18 NOTE — Telephone Encounter (Signed)
Patient left another message asking for a refill on birth control pills to get to visit on 07/24/2020. This was already sent on 07/03/2020, LVM letting patient know.

## 2020-07-24 ENCOUNTER — Ambulatory Visit (INDEPENDENT_AMBULATORY_CARE_PROVIDER_SITE_OTHER): Payer: Medicaid Other | Admitting: Physician Assistant

## 2020-07-24 ENCOUNTER — Other Ambulatory Visit: Payer: Self-pay

## 2020-07-24 VITALS — BP 121/82 | HR 89 | Ht 66.0 in | Wt 198.0 lb

## 2020-07-24 DIAGNOSIS — M5412 Radiculopathy, cervical region: Secondary | ICD-10-CM

## 2020-07-24 DIAGNOSIS — Z13220 Encounter for screening for lipoid disorders: Secondary | ICD-10-CM

## 2020-07-24 DIAGNOSIS — Z6831 Body mass index (BMI) 31.0-31.9, adult: Secondary | ICD-10-CM

## 2020-07-24 DIAGNOSIS — Z0000 Encounter for general adult medical examination without abnormal findings: Secondary | ICD-10-CM | POA: Diagnosis not present

## 2020-07-24 DIAGNOSIS — N926 Irregular menstruation, unspecified: Secondary | ICD-10-CM

## 2020-07-24 DIAGNOSIS — M542 Cervicalgia: Secondary | ICD-10-CM

## 2020-07-24 DIAGNOSIS — Z131 Encounter for screening for diabetes mellitus: Secondary | ICD-10-CM

## 2020-07-24 DIAGNOSIS — D508 Other iron deficiency anemias: Secondary | ICD-10-CM

## 2020-07-24 DIAGNOSIS — Z3041 Encounter for surveillance of contraceptive pills: Secondary | ICD-10-CM

## 2020-07-24 DIAGNOSIS — M549 Dorsalgia, unspecified: Secondary | ICD-10-CM

## 2020-07-24 DIAGNOSIS — E6609 Other obesity due to excess calories: Secondary | ICD-10-CM

## 2020-07-24 DIAGNOSIS — Z1329 Encounter for screening for other suspected endocrine disorder: Secondary | ICD-10-CM

## 2020-07-24 MED ORDER — PREDNISONE 50 MG PO TABS: ORAL_TABLET | ORAL | 0 refills | Status: DC

## 2020-07-24 MED ORDER — DICLOFENAC SODIUM 75 MG PO TBEC: 75.0000 mg | DELAYED_RELEASE_TABLET | Freq: Two times a day (BID) | ORAL | 2 refills | Status: DC

## 2020-07-24 MED ORDER — NORGESTIMATE-ETH ESTRADIOL 0.25-35 MG-MCG PO TABS: 1.0000 | ORAL_TABLET | Freq: Every day | ORAL | 3 refills | Status: DC

## 2020-07-24 NOTE — Progress Notes (Signed)
Subjective:     Erika Gay is a 40 y.o. female and is here for a comprehensive physical exam. The patient reports problems - she has missed a period and having left sided breast tenderness . She usually gets breast tenderness around her period but this has gone on for longer than it usually does and she is concerned. She is on her last week of OCP but has not missed any doses per patient.   She is also having some neck pain and upper left shoulder pain/tightness for over a year. She has hx of lumbar DDD and chronic low back pain but the neck pain is newer but going on for last year. At times it does radiate into left arm but mostly just sits in right upper shoulder. No injury to neck. Chiropractor does help for a little but then the pain comes back. She has tried OTC icy hot patches, massages with no relief.   Social History   Socioeconomic History   Marital status: Married    Spouse name: Not on file   Number of children: Not on file   Years of education: Not on file   Highest education level: Not on file  Occupational History   Not on file  Tobacco Use   Smoking status: Never   Smokeless tobacco: Never  Substance and Sexual Activity   Alcohol use: No   Drug use: No   Sexual activity: Yes    Partners: Male  Other Topics Concern   Not on file  Social History Narrative   Not on file   Social Determinants of Health   Financial Resource Strain: Not on file  Food Insecurity: Not on file  Transportation Needs: Not on file  Physical Activity: Not on file  Stress: Not on file  Social Connections: Not on file  Intimate Partner Violence: Not on file   Health Maintenance  Topic Date Due   Hepatitis C Screening  Never done   INFLUENZA VACCINE  09/02/2020   PAP SMEAR-Modifier  04/21/2022   TETANUS/TDAP  06/01/2027   COVID-19 Vaccine  Completed   HIV Screening  Completed   Pneumococcal Vaccine 41-44 Years old  Aged Out   HPV VACCINES  Aged Out    The following portions of  the patient's history were reviewed and updated as appropriate: allergies, current medications, past family history, past medical history, past social history, past surgical history, and problem list.  Review of Systems Pertinent items noted in HPI and remainder of comprehensive ROS otherwise negative.   Objective:    BP 121/82   Pulse 89   Ht 5\' 6"  (1.676 m)   Wt 198 lb (89.8 kg)   SpO2 99%   BMI 31.96 kg/m  General appearance: alert, cooperative, and appears stated age Head: Normocephalic, without obvious abnormality, atraumatic Eyes: conjunctivae/corneas clear. PERRL, EOM's intact. Fundi benign. Ears: normal TM's and external ear canals both ears Nose: Nares normal. Septum midline. Mucosa normal. No drainage or sinus tenderness. Throat: lips, mucosa, and tongue normal; teeth and gums normal Neck: no adenopathy, no carotid bruit, no JVD, supple, symmetrical, trachea midline, and thyroid not enlarged, symmetric, no tenderness/mass/nodules NROM of neck with some tightness left to right movement.  Back: symmetric, no curvature. ROM normal. No CVA tenderness. Neck pain decreased ROM to the left and right. No cervical spinal tenderness. Tightness and tenderness left upper back and shoulder. NROM of left shoulder. No tenderness to palpation of shoulder. Strength 5/5 upper extremities.  Lungs: clear to auscultation  bilaterally Heart: regular rate and rhythm, S1, S2 normal, no murmur, click, rub or gallop Abdomen: soft, non-tender; bowel sounds normal; no masses,  no organomegaly Extremities: extremities normal, atraumatic, no cyanosis or edema very tight upper left back and shoulder to palpation. Pain with ROM of shoulder in upper back but not shoulder. No tenderness over shoulder to palpitation.  Pulses: 2+ and symmetric Skin: Skin color, texture, turgor normal. No rashes or lesions Lymph nodes: Cervical, supraclavicular, and axillary nodes normal. Neurologic: Alert and oriented X 3, normal  strength and tone. Normal symmetric reflexes. Normal coordination and gait   .Marland Kitchen Depression screen [facility] 2/9 07/24/2020 04/21/2019 03/15/2018 11/09/2014  Decreased Interest 1 2 0 0  Down, Depressed, Hopeless 1 1 0 0  PHQ - 2 Score 2 3 0 0  Altered sleeping 1 0 - -  Tired, decreased energy 2 1 - -  Change in appetite 0 0 - -  Feeling bad or failure about yourself  0 0 - -  Trouble concentrating 0 0 - -  Moving slowly or fidgety/restless 0 0 - -  Suicidal thoughts 0 0 - -  PHQ-9 Score 5 4 - -  Difficult doing work/chores Somewhat difficult Somewhat difficult - -   .Marland Kitchen GAD 7 : Generalized Anxiety Score 07/24/2020 04/21/2019  Nervous, Anxious, on Edge 1 0  Control/stop worrying 1 0  Worry too much - different things 1 0  Trouble relaxing 1 0  Restless 0 0  Easily annoyed or irritable 0 0  Afraid - awful might happen 0 0  Total GAD 7 Score 4 0  Anxiety Difficulty Not difficult at all Somewhat difficult     Assessment:    Healthy female exam.      Plan:    Marland KitchenMarland KitchenAlviris was seen today for annual exam.  Diagnoses and all orders for this visit:  Routine physical examination -     CBC with Differential/Platelet -     COMPLETE METABOLIC PANEL WITH GFR -     Lipid Panel w/reflex Direct LDL -     TSH  Screening for diabetes mellitus -     COMPLETE METABOLIC PANEL WITH GFR  Screening for lipid disorders -     Lipid Panel w/reflex Direct LDL  Iron deficiency anemia secondary to inadequate dietary iron intake -     CBC with Differential/Platelet  Encounter for surveillance of contraceptive pills -     norgestimate-ethinyl estradiol (SPRINTEC 28) 0.25-35 MG-MCG tablet; Take 1 tablet by mouth daily.  Thyroid disorder screen -     TSH  Class 1 obesity due to excess calories without serious comorbidity with body mass index (BMI) of 31.0 to 31.9 in adult  Missed period -     POCT Pregnancy, Urine  Neck pain -     predniSONE (DELTASONE) 50 MG tablet; Take one tablet for 5 days. -      diclofenac (VOLTAREN) 75 MG EC tablet; Take 1 tablet (75 mg total) by mouth 2 (two) times daily. -     MR Cervical Spine Wo Contrast; Future  Upper back pain on left side -     predniSONE (DELTASONE) 50 MG tablet; Take one tablet for 5 days. -     diclofenac (VOLTAREN) 75 MG EC tablet; Take 1 tablet (75 mg total) by mouth 2 (two) times daily. -     MR Cervical Spine Wo Contrast; Future  Cervical radiculitis -     MR Cervical Spine Wo Contrast; Future  Other orders -  phentermine 37.5 MG capsule; Take 1 capsule (37.5 mg total) by mouth every morning.  .. Discussed 150 minutes of exercise a week.  Encouraged vitamin D 1000 units and Calcium 1300mg  or 4 servings of dairy a day.  PHQ/GAD WNL.  Missed period but on OCP. UPT in office negative. Refilled OCP. Declined STD screening. PAP up to date.  Mammogram after November.  Fasting labs ordered.  Covid vaccines x3.   December.Discussed low carb diet with 1500 calories and 80g of protein.  Exercising at least 150 minutes a week.  My Fitness Pal could be a Marland Kitchen.  Pt would like another round of phentermine. She does not have insurance and cannot afford other weight loss medications. It has been over a year since last dosages.   Pt does not have insurance but she does want imaging. Discussed prednisone burst.  Start diclofenac.  Consider PT with dry needling but cost with no insurance is concern.  Pt wants MRI and to bypass xray since she does not have insurance.   See After Visit Summary for Counseling Recommendations

## 2020-07-24 NOTE — Patient Instructions (Signed)
Health Maintenance, Female Adopting a healthy lifestyle and getting preventive care are important in promoting health and wellness. Ask your health care provider about: The right schedule for you to have regular tests and exams. Things you can do on your own to prevent diseases and keep yourself healthy. What should I know about diet, weight, and exercise? Eat a healthy diet  Eat a diet that includes plenty of vegetables, fruits, low-fat dairy products, and lean protein. Do not eat a lot of foods that are high in solid fats, added sugars, or sodium.  Maintain a healthy weight Body mass index (BMI) is used to identify weight problems. It estimates body fat based on height and weight. Your health care provider can help determineyour BMI and help you achieve or maintain a healthy weight. Get regular exercise Get regular exercise. This is one of the most important things you can do for your health. Most adults should: Exercise for at least 150 minutes each week. The exercise should increase your heart rate and make you sweat (moderate-intensity exercise). Do strengthening exercises at least twice a week. This is in addition to the moderate-intensity exercise. Spend less time sitting. Even light physical activity can be beneficial. Watch cholesterol and blood lipids Have your blood tested for lipids and cholesterol at 40 years of age, then havethis test every 5 years. Have your cholesterol levels checked more often if: Your lipid or cholesterol levels are high. You are older than 40 years of age. You are at high risk for heart disease. What should I know about cancer screening? Depending on your health history and family history, you may need to have cancer screening at various ages. This may include screening for: Breast cancer. Cervical cancer. Colorectal cancer. Skin cancer. Lung cancer. What should I know about heart disease, diabetes, and high blood pressure? Blood pressure and heart  disease High blood pressure causes heart disease and increases the risk of stroke. This is more likely to develop in people who have high blood pressure readings, are of African descent, or are overweight. Have your blood pressure checked: Every 3-5 years if you are 18-39 years of age. Every year if you are 40 years old or older. Diabetes Have regular diabetes screenings. This checks your fasting blood sugar level. Have the screening done: Once every three years after age 40 if you are at a normal weight and have a low risk for diabetes. More often and at a younger age if you are overweight or have a high risk for diabetes. What should I know about preventing infection? Hepatitis B If you have a higher risk for hepatitis B, you should be screened for this virus. Talk with your health care provider to find out if you are at risk forhepatitis B infection. Hepatitis C Testing is recommended for: Everyone born from 1945 through 1965. Anyone with known risk factors for hepatitis C. Sexually transmitted infections (STIs) Get screened for STIs, including gonorrhea and chlamydia, if: You are sexually active and are younger than 40 years of age. You are older than 40 years of age and your health care provider tells you that you are at risk for this type of infection. Your sexual activity has changed since you were last screened, and you are at increased risk for chlamydia or gonorrhea. Ask your health care provider if you are at risk. Ask your health care provider about whether you are at high risk for HIV. Your health care provider may recommend a prescription medicine to help   prevent HIV infection. If you choose to take medicine to prevent HIV, you should first get tested for HIV. You should then be tested every 3 months for as long as you are taking the medicine. Pregnancy If you are about to stop having your period (premenopausal) and you may become pregnant, seek counseling before you get  pregnant. Take 400 to 800 micrograms (mcg) of folic acid every day if you become pregnant. Ask for birth control (contraception) if you want to prevent pregnancy. Osteoporosis and menopause Osteoporosis is a disease in which the bones lose minerals and strength with aging. This can result in bone fractures. If you are 65 years old or older, or if you are at risk for osteoporosis and fractures, ask your health care provider if you should: Be screened for bone loss. Take a calcium or vitamin D supplement to lower your risk of fractures. Be given hormone replacement therapy (HRT) to treat symptoms of menopause. Follow these instructions at home: Lifestyle Do not use any products that contain nicotine or tobacco, such as cigarettes, e-cigarettes, and chewing tobacco. If you need help quitting, ask your health care provider. Do not use street drugs. Do not share needles. Ask your health care provider for help if you need support or information about quitting drugs. Alcohol use Do not drink alcohol if: Your health care provider tells you not to drink. You are pregnant, may be pregnant, or are planning to become pregnant. If you drink alcohol: Limit how much you use to 0-1 drink a day. Limit intake if you are breastfeeding. Be aware of how much alcohol is in your drink. In the [place] one drink equals one 12 oz bottle of beer (355 mL), one 5 oz glass of wine (148 mL), or one 1 oz glass of hard liquor (44 mL). General instructions Schedule regular health, dental, and eye exams. Stay current with your vaccines. Tell your health care provider if: You often feel depressed. You have ever been abused or do not feel safe at home. Summary Adopting a healthy lifestyle and getting preventive care are important in promoting health and wellness. Follow your health care provider's instructions about healthy diet, exercising, and getting tested or screened for diseases. Follow your health care provider's  instructions on monitoring your cholesterol and blood pressure. This information is not intended to replace advice given to you by your health care provider. Make sure you discuss any questions you have with your healthcare provider. Document Revised: 01/12/2018 Document Reviewed: 01/12/2018 Elsevier Patient Education  2022 [employer]vier Inc.  

## 2020-07-25 ENCOUNTER — Encounter: Payer: Self-pay | Admitting: Physician Assistant

## 2020-07-25 DIAGNOSIS — Z13220 Encounter for screening for lipoid disorders: Secondary | ICD-10-CM

## 2020-07-25 DIAGNOSIS — D508 Other iron deficiency anemias: Secondary | ICD-10-CM

## 2020-07-25 DIAGNOSIS — Z0000 Encounter for general adult medical examination without abnormal findings: Secondary | ICD-10-CM

## 2020-07-25 DIAGNOSIS — Z131 Encounter for screening for diabetes mellitus: Secondary | ICD-10-CM

## 2020-07-25 DIAGNOSIS — M5412 Radiculopathy, cervical region: Secondary | ICD-10-CM

## 2020-07-25 DIAGNOSIS — Z1329 Encounter for screening for other suspected endocrine disorder: Secondary | ICD-10-CM

## 2020-07-25 DIAGNOSIS — E559 Vitamin D deficiency, unspecified: Secondary | ICD-10-CM

## 2020-07-29 DIAGNOSIS — M549 Dorsalgia, unspecified: Secondary | ICD-10-CM | POA: Insufficient documentation

## 2020-07-29 DIAGNOSIS — M542 Cervicalgia: Secondary | ICD-10-CM | POA: Insufficient documentation

## 2020-07-29 MED ORDER — PHENTERMINE HCL 37.5 MG PO CAPS: 37.5000 mg | ORAL_CAPSULE | ORAL | 0 refills | Status: DC

## 2020-07-30 ENCOUNTER — Encounter: Payer: Self-pay | Admitting: Physician Assistant

## 2020-08-03 ENCOUNTER — Ambulatory Visit (INDEPENDENT_AMBULATORY_CARE_PROVIDER_SITE_OTHER): Payer: Self-pay

## 2020-08-03 ENCOUNTER — Other Ambulatory Visit: Payer: Self-pay

## 2020-08-03 DIAGNOSIS — M542 Cervicalgia: Secondary | ICD-10-CM

## 2020-08-03 DIAGNOSIS — M5412 Radiculopathy, cervical region: Secondary | ICD-10-CM

## 2020-08-07 ENCOUNTER — Encounter: Payer: Self-pay | Admitting: Physician Assistant

## 2020-08-07 DIAGNOSIS — M4802 Spinal stenosis, cervical region: Secondary | ICD-10-CM

## 2020-08-07 DIAGNOSIS — M5030 Other cervical disc degeneration, unspecified cervical region: Secondary | ICD-10-CM | POA: Insufficient documentation

## 2020-08-07 NOTE — Progress Notes (Signed)
Your MRI shows reasons to hurt. You have bone spur(osteophyte) at C5/C6 creating some narrowing in the spinal canal and a small herniated disc pushing in at C6-7. You need to see orthopedics for evaluation and treatment plan. Do you have any preference? Or at the very least see Dr. Karie Schwalbe for next steps planning.

## 2020-08-30 LAB — CBC WITH DIFFERENTIAL/PLATELET
Absolute Monocytes: 389 cells/uL (ref 200–950)
Basophils Absolute: 27 cells/uL (ref 0–200)
Basophils Relative: 0.4 %
Eosinophils Absolute: 101 cells/uL (ref 15–500)
Eosinophils Relative: 1.5 %
HCT: 40.6 % (ref 35.0–45.0)
Hemoglobin: 13 g/dL (ref 11.7–15.5)
Lymphs Abs: 2566 cells/uL (ref 850–3900)
MCH: 26.9 pg — ABNORMAL LOW (ref 27.0–33.0)
MCHC: 32 g/dL (ref 32.0–36.0)
MCV: 84.1 fL (ref 80.0–100.0)
MPV: 11.2 fL (ref 7.5–12.5)
Monocytes Relative: 5.8 %
Neutro Abs: 3618 cells/uL (ref 1500–7800)
Neutrophils Relative %: 54 %
Platelets: 323 10*3/uL (ref 140–400)
RBC: 4.83 10*6/uL (ref 3.80–5.10)
RDW: 12.6 % (ref 11.0–15.0)
Total Lymphocyte: 38.3 %
WBC: 6.7 10*3/uL (ref 3.8–10.8)

## 2020-08-30 LAB — COMPLETE METABOLIC PANEL WITH GFR
AG Ratio: 1.4 (calc) (ref 1.0–2.5)
ALT: 27 U/L (ref 6–29)
AST: 22 U/L (ref 10–30)
Albumin: 4.2 g/dL (ref 3.6–5.1)
Alkaline phosphatase (APISO): 47 U/L (ref 31–125)
BUN: 11 mg/dL (ref 7–25)
CO2: 27 mmol/L (ref 20–32)
Calcium: 9.6 mg/dL (ref 8.6–10.2)
Chloride: 106 mmol/L (ref 98–110)
Creat: 0.93 mg/dL (ref 0.50–0.97)
Globulin: 3 g/dL (calc) (ref 1.9–3.7)
Glucose, Bld: 80 mg/dL (ref 65–99)
Potassium: 4.2 mmol/L (ref 3.5–5.3)
Sodium: 141 mmol/L (ref 135–146)
Total Bilirubin: 0.4 mg/dL (ref 0.2–1.2)
Total Protein: 7.2 g/dL (ref 6.1–8.1)
eGFR: 80 mL/min/{1.73_m2} (ref >=60)

## 2020-08-30 LAB — LIPID PANEL W/REFLEX DIRECT LDL
Cholesterol: 156 mg/dL (ref <200)
HDL: 53 mg/dL (ref >=50)
LDL Cholesterol (Calc): 78 mg/dL (calc)
Non-HDL Cholesterol (Calc): 103 mg/dL (calc) (ref <130)
Total CHOL/HDL Ratio: 2.9 (calc) (ref <5.0)
Triglycerides: 148 mg/dL (ref <150)

## 2020-08-30 LAB — IRON,TIBC AND FERRITIN PANEL
%SAT: 26 % (calc) (ref 16–45)
Ferritin: 45 ng/mL (ref 16–154)
Iron: 84 ug/dL (ref 40–190)
TIBC: 319 mcg/dL (calc) (ref 250–450)

## 2020-08-30 LAB — VITAMIN D 25 HYDROXY (VIT D DEFICIENCY, FRACTURES): Vit D, 25-Hydroxy: 68 ng/mL (ref 30–100)

## 2020-08-30 LAB — TSH: TSH: 1.49 mIU/L

## 2020-09-02 NOTE — Progress Notes (Signed)
Erika Gay,   Cholesterol looks great.  Kidney, liver, glucose look good.  Thyroid perfect.  Hemoglobin looks great.  Iron and iron stores look good.  Vitamin D looks GREAT.

## 2020-10-30 ENCOUNTER — Encounter: Payer: Self-pay | Admitting: Physician Assistant

## 2021-02-27 ENCOUNTER — Encounter: Payer: Self-pay | Admitting: Physician Assistant

## 2021-02-27 DIAGNOSIS — Q825 Congenital non-neoplastic nevus: Secondary | ICD-10-CM

## 2021-02-27 DIAGNOSIS — R21 Rash and other nonspecific skin eruption: Secondary | ICD-10-CM

## 2021-02-27 DIAGNOSIS — L918 Other hypertrophic disorders of the skin: Secondary | ICD-10-CM

## 2021-03-10 ENCOUNTER — Other Ambulatory Visit: Payer: Self-pay | Admitting: Physician Assistant

## 2021-03-10 DIAGNOSIS — Z1231 Encounter for screening mammogram for malignant neoplasm of breast: Secondary | ICD-10-CM

## 2021-03-17 ENCOUNTER — Encounter: Payer: Self-pay | Admitting: Physician Assistant

## 2021-03-17 NOTE — Telephone Encounter (Signed)
Patient's husband has been scheduled. AM

## 2021-03-20 ENCOUNTER — Ambulatory Visit (INDEPENDENT_AMBULATORY_CARE_PROVIDER_SITE_OTHER): Payer: PRIVATE HEALTH INSURANCE

## 2021-03-20 ENCOUNTER — Other Ambulatory Visit: Payer: Self-pay

## 2021-03-20 DIAGNOSIS — Z1231 Encounter for screening mammogram for malignant neoplasm of breast: Secondary | ICD-10-CM

## 2021-03-21 ENCOUNTER — Encounter: Payer: Self-pay | Admitting: Physician Assistant

## 2021-03-21 NOTE — Progress Notes (Signed)
Normal mammogram. Follow up in 1 year.

## 2021-04-23 ENCOUNTER — Other Ambulatory Visit: Payer: Self-pay | Admitting: Physician Assistant

## 2021-04-23 DIAGNOSIS — E6609 Other obesity due to excess calories: Secondary | ICD-10-CM

## 2021-04-25 ENCOUNTER — Encounter: Payer: Self-pay | Admitting: Physician Assistant

## 2021-05-05 ENCOUNTER — Ambulatory Visit (INDEPENDENT_AMBULATORY_CARE_PROVIDER_SITE_OTHER): Payer: 59 | Admitting: Physician Assistant

## 2021-05-05 ENCOUNTER — Encounter: Payer: Self-pay | Admitting: Physician Assistant

## 2021-05-05 VITALS — BP 116/74 | HR 87 | Resp 16 | Ht 66.0 in | Wt 197.0 lb

## 2021-05-05 DIAGNOSIS — Z3041 Encounter for surveillance of contraceptive pills: Secondary | ICD-10-CM | POA: Diagnosis not present

## 2021-05-05 DIAGNOSIS — M542 Cervicalgia: Secondary | ICD-10-CM

## 2021-05-05 DIAGNOSIS — E6609 Other obesity due to excess calories: Secondary | ICD-10-CM

## 2021-05-05 DIAGNOSIS — M549 Dorsalgia, unspecified: Secondary | ICD-10-CM | POA: Diagnosis not present

## 2021-05-05 DIAGNOSIS — Z6831 Body mass index (BMI) 31.0-31.9, adult: Secondary | ICD-10-CM

## 2021-05-05 MED ORDER — PHENTERMINE HCL 37.5 MG PO CAPS: 37.5000 mg | ORAL_CAPSULE | ORAL | 0 refills | Status: DC

## 2021-05-05 MED ORDER — NORGESTIMATE-ETH ESTRADIOL 0.25-35 MG-MCG PO TABS: 1.0000 | ORAL_TABLET | Freq: Every day | ORAL | 3 refills | Status: DC

## 2021-05-05 MED ORDER — DICLOFENAC SODIUM 75 MG PO TBEC: 75.0000 mg | DELAYED_RELEASE_TABLET | Freq: Two times a day (BID) | ORAL | 1 refills | Status: AC

## 2021-05-05 NOTE — Progress Notes (Signed)
? ?Subjective:  ? ? Patient ID: Erika Gay, female    DOB: [birth year], 41 y.o.   MRN: [id number] ? ?HPI ?Pt is a 41 yo obese female who presents to the clinic to discuss restart phentermine. She was not consistent with phentermine last June 2022 when last sent to pharmacy. She is ready to use it daily and exercise and watch her diet. She denies any side effects that concern her. She is more moody but she can handle that.  ? ?She needs refills of OCP.  ? ?Her chronic neck pain continues she would like to try the diclofenac now. She is seeing an osteopathic doctor for neck pain.  ? ?.. ?Active Ambulatory Problems  ?  Diagnosis Date Noted  ? Chronic pelvic pain in female 06/23/2011  ? Lumbar degenerative disc disease 08/01/2015  ? Costochondritis 08/01/2015  ? Asymptomatic gallstones 10/15/2015  ? Atypical squamous cells of undetermined significance on cytologic smear of cervix (ASC-US) 07/27/2016  ? Mass of left breast 06/12/2015  ? Myofascial pain 05/24/2015  ? Wrist pain, chronic, unspecified laterality 06/12/2016  ? Overweight (BMI 25.0-29.9) 07/30/2016  ? Abnormal weight gain 07/30/2016  ? Cesarean delivery delivered 07/24/2017  ? Class 1 obesity due to excess calories without serious comorbidity with body mass index (BMI) of 31.0 to 31.9 in adult 03/09/2018  ? No energy 03/15/2018  ? Vitamin D insufficiency 04/24/2019  ? Iron deficiency anemia secondary to inadequate dietary iron intake 04/24/2019  ? Neck pain 07/29/2020  ? Upper back pain on left side 07/29/2020  ? DDD (degenerative disc disease), cervical 08/07/2020  ? ?Resolved Ambulatory Problems  ?  Diagnosis Date Noted  ? Obesity 10/04/2015  ? ?Past Medical History:  ?Diagnosis Date  ? Infection   ? Pelvic pain   ? ? ? ? ? ?Review of Systems ?See HPI.  ?   ?Objective:  ? Physical Exam ?Vitals reviewed.  ?Constitutional:   ?   Appearance: Normal appearance. She is obese.  ?HENT:  ?   Head: Normocephalic.  ?Cardiovascular:  ?   Rate and Rhythm: Normal  rate and regular rhythm.  ?   Pulses: Normal pulses.  ?Pulmonary:  ?   Effort: Pulmonary effort is normal.  ?   Breath sounds: Normal breath sounds.  ?Musculoskeletal:  ?   Right lower leg: No edema.  ?   Left lower leg: No edema.  ?Neurological:  ?   General: No focal deficit present.  ?   Mental Status: She is alert and oriented to person, place, and time.  ?Psychiatric:     ?   Mood and Affect: Mood normal.  ? ? ? ? ? ?   ?Assessment & Plan:  ?..Agustina was seen today for weight management . ? ?Diagnoses and all orders for this visit: ? ?Class 1 obesity due to excess calories without serious comorbidity with body mass index (BMI) of 31.0 to 31.9 in adult ?-     phentermine 37.5 MG capsule; Take 1 capsule (37.5 mg total) by mouth every morning. ? ?Encounter for surveillance of contraceptive pills ?-     norgestimate-ethinyl estradiol (SPRINTEC 28) 0.25-35 MG-MCG tablet; Take 1 tablet by mouth daily. ? ?Neck pain ?-     diclofenac (VOLTAREN) 75 MG EC tablet; Take 1 tablet (75 mg total) by mouth 2 (two) times daily. ? ?Upper back pain on left side ?-     diclofenac (VOLTAREN) 75 MG EC tablet; Take 1 tablet (75 mg total) by mouth 2 (two) times  daily. ? ? ? ?Pap UTD. Refilled OCP for 1 year. Declined STD screening.  ? ?Continue with osteopathic provider. Diclofenac to try sent to pharmacy. Do not take with other NSAIDs.  ? ?..Discussed low carb diet with 1500 calories and 80g of protein.  ?Exercising at least 150 minutes a week.  ?My Fitness Pal could be a Chief Technology Officer.  ?Start phentermine. Discussed SE's. Follow up as needed or in 3 months.  ?

## 2021-11-18 ENCOUNTER — Encounter: Payer: Self-pay | Admitting: Physician Assistant

## 2021-11-18 ENCOUNTER — Ambulatory Visit (INDEPENDENT_AMBULATORY_CARE_PROVIDER_SITE_OTHER): Payer: Commercial Managed Care - HMO | Admitting: Physician Assistant

## 2021-11-18 VITALS — BP 134/82 | HR 86 | Temp 98.4°F | Ht 66.0 in | Wt 193.0 lb

## 2021-11-18 DIAGNOSIS — N898 Other specified noninflammatory disorders of vagina: Secondary | ICD-10-CM

## 2021-11-18 DIAGNOSIS — N926 Irregular menstruation, unspecified: Secondary | ICD-10-CM

## 2021-11-18 DIAGNOSIS — E6609 Other obesity due to excess calories: Secondary | ICD-10-CM

## 2021-11-18 DIAGNOSIS — R300 Dysuria: Secondary | ICD-10-CM

## 2021-11-18 DIAGNOSIS — Z3041 Encounter for surveillance of contraceptive pills: Secondary | ICD-10-CM | POA: Diagnosis not present

## 2021-11-18 DIAGNOSIS — Z6831 Body mass index (BMI) 31.0-31.9, adult: Secondary | ICD-10-CM

## 2021-11-18 LAB — POCT URINALYSIS DIP (CLINITEK)
Bilirubin, UA: NEGATIVE
Blood, UA: NEGATIVE
Glucose, UA: NEGATIVE mg/dL
Ketones, POC UA: NEGATIVE mg/dL
Leukocytes, UA: NEGATIVE
Nitrite, UA: NEGATIVE
POC PROTEIN,UA: NEGATIVE
Spec Grav, UA: 1.015 (ref 1.010–1.025)
Urobilinogen, UA: 0.2 E.U./dL
pH, UA: 6.5 (ref 5.0–8.0)

## 2021-11-18 LAB — POCT URINE PREGNANCY: Preg Test, Ur: NEGATIVE

## 2021-11-18 MED ORDER — METRONIDAZOLE 500 MG PO TABS: 500.0000 mg | ORAL_TABLET | Freq: Two times a day (BID) | ORAL | 0 refills | Status: AC

## 2021-11-18 MED ORDER — NORGESTIMATE-ETH ESTRADIOL 0.25-35 MG-MCG PO TABS: 1.0000 | ORAL_TABLET | Freq: Every day | ORAL | 3 refills | Status: DC

## 2021-11-18 NOTE — Progress Notes (Signed)
Acute Office Visit  Subjective:     Patient ID: Erika Gay, female    DOB: Apr 12, 1980, 41 y.o.   MRN: [id number]  Chief Complaint  Patient presents with   Vaginal Itching    HPI Patient is in today for vaginal itching for last few days. Her last period was also very light and wants to make sure she is not pregnant. She is on OCP. She started itching after her cycle. She tried an OTC cream of "something" but did not help. No recent abx. Not sure if more vaginal discharge than normal. She is having some dysuria. No fever, chills, abdominal or flank pain. No nausea or vomiting. She has had BV in the past. She is in monogamous relationship with husband and no concerns for STD testing. No pain with intercourse.   .. Active Ambulatory Problems    Diagnosis Date Noted   Chronic pelvic pain in female 06/23/2011   Lumbar degenerative disc disease 08/01/2015   Costochondritis 08/01/2015   Asymptomatic gallstones 10/15/2015   Atypical squamous cells of undetermined significance on cytologic smear of cervix (ASC-US) 07/27/2016   Mass of left breast 06/12/2015   Myofascial pain 05/24/2015   Wrist pain, chronic, unspecified laterality 06/12/2016   Overweight (BMI 25.0-29.9) 07/30/2016   Abnormal weight gain 07/30/2016   Cesarean delivery delivered 07/24/2017   Class 1 obesity due to excess calories without serious comorbidity with body mass index (BMI) of 31.0 to 31.9 in adult 03/09/2018   No energy 03/15/2018   Vitamin D insufficiency 04/24/2019   Iron deficiency anemia secondary to inadequate dietary iron intake 04/24/2019   Neck pain 07/29/2020   Upper back pain on left side 07/29/2020   DDD (degenerative disc disease), cervical 08/07/2020   Resolved Ambulatory Problems    Diagnosis Date Noted   Obesity 10/04/2015   Past Medical History:  Diagnosis Date   Infection    Pelvic pain      ROS  See HPI.     Objective:    BP 134/82   Pulse 86   Temp 98.4 F (36.9 C)  (Oral)   Ht 5\' 6"  (1.676 m)   Wt 193 lb (87.5 kg)   SpO2 100%   BMI 31.15 kg/m  BP Readings from Last 3 Encounters:  11/18/21 134/82  05/05/21 116/74  07/24/20 121/82   Wt Readings from Last 3 Encounters:  11/18/21 193 lb (87.5 kg)  05/05/21 197 lb (89.4 kg)  07/24/20 198 lb (89.8 kg)    .07/26/20 Results for orders placed or performed in visit on 11/18/21  POCT urine pregnancy  Result Value Ref Range   Preg Test, Ur Negative Negative  POCT URINALYSIS DIP (CLINITEK)  Result Value Ref Range   Color, UA yellow yellow   Clarity, UA clear clear   Glucose, UA negative negative mg/dL   Bilirubin, UA negative negative   Ketones, POC UA negative negative mg/dL   Spec Grav, UA 11/20/21 6.503 - 1.025   Blood, UA negative negative   pH, UA 6.5 5.0 - 8.0   POC PROTEIN,UA negative negative, trace   Urobilinogen, UA 0.2 0.2 or 1.0 E.U./dL   Nitrite, UA Negative Negative   Leukocytes, UA Negative Negative     Physical Exam Constitutional:      Appearance: Normal appearance. She is obese.  Cardiovascular:     Rate and Rhythm: Normal rate and regular rhythm.  Pulmonary:     Effort: Pulmonary effort is normal.     Breath sounds: Normal  breath sounds.  Abdominal:     General: Bowel sounds are normal. There is no distension.     Palpations: Abdomen is soft. There is no mass.     Tenderness: There is no abdominal tenderness. There is no right CVA tenderness, left CVA tenderness, guarding or rebound.  Musculoskeletal:     Right lower leg: Edema present.  Neurological:     Mental Status: She is alert.  Psychiatric:        Mood and Affect: Mood normal.         Assessment & Plan:  Marland KitchenMarland KitchenAlviris was seen today for vaginal itching.  Diagnoses and all orders for this visit:  Vagina itching -     WET PREP FOR TRICH, YEAST, CLUE -     metroNIDAZOLE (FLAGYL) 500 MG tablet; Take 1 tablet (500 mg total) by mouth 2 (two) times daily for 7 days.  Abnormal menses -     POCT urine  pregnancy  Dysuria -     metroNIDAZOLE (FLAGYL) 500 MG tablet; Take 1 tablet (500 mg total) by mouth 2 (two) times daily for 7 days. -     POCT URINALYSIS DIP (CLINITEK)  Encounter for surveillance of contraceptive pills -     norgestimate-ethinyl estradiol (Graham 28) 0.25-35 MG-MCG tablet; Take 1 tablet by mouth daily.  Class 1 obesity due to excess calories without serious comorbidity with body mass index (BMI) of 31.0 to 31.9 in adult   UPT is negative UA negative for blood, leuks, nitrites, protein. Wet prep ordered Declined STD testing Treated empirically for BV due to symptoms and history with metronidazole for 7 days  Pap UTD Refilled OcP  .Marland KitchenDiscussed low carb diet with 1500 calories and 80g of protein.  Exercising at least 150 minutes a week.  My Fitness Pal could be a Microbiologist.  Discussed medication options Her insurance has changed and going to see if they cover anything  Iran Planas, PA-C

## 2021-11-18 NOTE — Patient Instructions (Signed)
Start metronidazole.  Will call with wet prep results.

## 2021-11-19 LAB — WET PREP FOR TRICH, YEAST, CLUE
MICRO NUMBER:: 14065873
Specimen Quality: ADEQUATE

## 2021-11-19 NOTE — Progress Notes (Signed)
No yeast/trichomonas/yeast seen on wet prep. Complete metronidazole and if symptoms continue let me know.

## 2021-11-21 ENCOUNTER — Encounter: Payer: Self-pay | Admitting: Physician Assistant

## 2021-11-21 ENCOUNTER — Other Ambulatory Visit: Payer: Self-pay | Admitting: Physician Assistant

## 2021-11-21 DIAGNOSIS — L309 Dermatitis, unspecified: Secondary | ICD-10-CM

## 2021-11-21 MED ORDER — VALACYCLOVIR HCL 1 G PO TABS: 1000.0000 mg | ORAL_TABLET | Freq: Two times a day (BID) | ORAL | 0 refills | Status: AC

## 2021-11-21 MED ORDER — CLOBETASOL PROPIONATE 0.05 % EX CREA: TOPICAL_CREAM | CUTANEOUS | 0 refills | Status: AC

## 2021-11-21 MED ORDER — FLUCONAZOLE 150 MG PO TABS: 150.0000 mg | ORAL_TABLET | Freq: Once | ORAL | 0 refills | Status: AC

## 2021-11-21 NOTE — Progress Notes (Signed)
Sent diflucan/valtrex

## 2022-03-16 ENCOUNTER — Other Ambulatory Visit: Payer: Self-pay | Admitting: Neurology

## 2022-03-16 DIAGNOSIS — Z3041 Encounter for surveillance of contraceptive pills: Secondary | ICD-10-CM

## 2022-03-16 MED ORDER — NORGESTIMATE-ETH ESTRADIOL 0.25-35 MG-MCG PO TABS: 1.0000 | ORAL_TABLET | Freq: Every day | ORAL | 3 refills | Status: DC

## 2022-03-23 ENCOUNTER — Other Ambulatory Visit: Payer: Self-pay | Admitting: Physician Assistant

## 2022-03-23 DIAGNOSIS — Z1231 Encounter for screening mammogram for malignant neoplasm of breast: Secondary | ICD-10-CM

## 2022-03-26 ENCOUNTER — Ambulatory Visit (INDEPENDENT_AMBULATORY_CARE_PROVIDER_SITE_OTHER): Payer: 59

## 2022-03-26 ENCOUNTER — Ambulatory Visit: Payer: Self-pay

## 2022-03-26 DIAGNOSIS — Z1231 Encounter for screening mammogram for malignant neoplasm of breast: Secondary | ICD-10-CM | POA: Diagnosis not present

## 2022-03-30 ENCOUNTER — Encounter: Payer: Self-pay | Admitting: Physician Assistant

## 2022-03-31 NOTE — Progress Notes (Signed)
Normal mammogram. Follow up in 1 year.

## 2022-07-04 DIAGNOSIS — Z419 Encounter for procedure for purposes other than remedying health state, unspecified: Secondary | ICD-10-CM | POA: Diagnosis not present

## 2022-07-06 DIAGNOSIS — M5386 Other specified dorsopathies, lumbar region: Secondary | ICD-10-CM | POA: Diagnosis not present

## 2022-07-06 DIAGNOSIS — M9904 Segmental and somatic dysfunction of sacral region: Secondary | ICD-10-CM | POA: Diagnosis not present

## 2022-07-06 DIAGNOSIS — M9903 Segmental and somatic dysfunction of lumbar region: Secondary | ICD-10-CM | POA: Diagnosis not present

## 2022-07-06 DIAGNOSIS — M9905 Segmental and somatic dysfunction of pelvic region: Secondary | ICD-10-CM | POA: Diagnosis not present

## 2022-07-07 DIAGNOSIS — M9903 Segmental and somatic dysfunction of lumbar region: Secondary | ICD-10-CM | POA: Diagnosis not present

## 2022-07-07 DIAGNOSIS — M9904 Segmental and somatic dysfunction of sacral region: Secondary | ICD-10-CM | POA: Diagnosis not present

## 2022-07-07 DIAGNOSIS — M9905 Segmental and somatic dysfunction of pelvic region: Secondary | ICD-10-CM | POA: Diagnosis not present

## 2022-07-07 DIAGNOSIS — M5386 Other specified dorsopathies, lumbar region: Secondary | ICD-10-CM | POA: Diagnosis not present

## 2022-07-08 DIAGNOSIS — M9903 Segmental and somatic dysfunction of lumbar region: Secondary | ICD-10-CM | POA: Diagnosis not present

## 2022-07-08 DIAGNOSIS — M5386 Other specified dorsopathies, lumbar region: Secondary | ICD-10-CM | POA: Diagnosis not present

## 2022-07-08 DIAGNOSIS — M9905 Segmental and somatic dysfunction of pelvic region: Secondary | ICD-10-CM | POA: Diagnosis not present

## 2022-07-08 DIAGNOSIS — M9904 Segmental and somatic dysfunction of sacral region: Secondary | ICD-10-CM | POA: Diagnosis not present

## 2022-07-20 DIAGNOSIS — M9903 Segmental and somatic dysfunction of lumbar region: Secondary | ICD-10-CM | POA: Diagnosis not present

## 2022-07-20 DIAGNOSIS — M9904 Segmental and somatic dysfunction of sacral region: Secondary | ICD-10-CM | POA: Diagnosis not present

## 2022-07-20 DIAGNOSIS — M9905 Segmental and somatic dysfunction of pelvic region: Secondary | ICD-10-CM | POA: Diagnosis not present

## 2022-07-20 DIAGNOSIS — M5386 Other specified dorsopathies, lumbar region: Secondary | ICD-10-CM | POA: Diagnosis not present

## 2022-08-03 DIAGNOSIS — Z419 Encounter for procedure for purposes other than remedying health state, unspecified: Secondary | ICD-10-CM | POA: Diagnosis not present

## 2022-09-03 DIAGNOSIS — Z419 Encounter for procedure for purposes other than remedying health state, unspecified: Secondary | ICD-10-CM | POA: Diagnosis not present

## 2022-10-04 DIAGNOSIS — Z419 Encounter for procedure for purposes other than remedying health state, unspecified: Secondary | ICD-10-CM | POA: Diagnosis not present

## 2022-11-03 DIAGNOSIS — Z419 Encounter for procedure for purposes other than remedying health state, unspecified: Secondary | ICD-10-CM | POA: Diagnosis not present

## 2022-12-04 DIAGNOSIS — Z419 Encounter for procedure for purposes other than remedying health state, unspecified: Secondary | ICD-10-CM | POA: Diagnosis not present

## 2022-12-16 DIAGNOSIS — M9902 Segmental and somatic dysfunction of thoracic region: Secondary | ICD-10-CM | POA: Diagnosis not present

## 2022-12-16 DIAGNOSIS — M9903 Segmental and somatic dysfunction of lumbar region: Secondary | ICD-10-CM | POA: Diagnosis not present

## 2022-12-16 DIAGNOSIS — M9904 Segmental and somatic dysfunction of sacral region: Secondary | ICD-10-CM | POA: Diagnosis not present

## 2022-12-16 DIAGNOSIS — M9901 Segmental and somatic dysfunction of cervical region: Secondary | ICD-10-CM | POA: Diagnosis not present

## 2022-12-22 DIAGNOSIS — M9904 Segmental and somatic dysfunction of sacral region: Secondary | ICD-10-CM | POA: Diagnosis not present

## 2022-12-22 DIAGNOSIS — M9901 Segmental and somatic dysfunction of cervical region: Secondary | ICD-10-CM | POA: Diagnosis not present

## 2022-12-22 DIAGNOSIS — M9902 Segmental and somatic dysfunction of thoracic region: Secondary | ICD-10-CM | POA: Diagnosis not present

## 2022-12-22 DIAGNOSIS — M9903 Segmental and somatic dysfunction of lumbar region: Secondary | ICD-10-CM | POA: Diagnosis not present

## 2022-12-24 DIAGNOSIS — M9903 Segmental and somatic dysfunction of lumbar region: Secondary | ICD-10-CM | POA: Diagnosis not present

## 2022-12-24 DIAGNOSIS — M9904 Segmental and somatic dysfunction of sacral region: Secondary | ICD-10-CM | POA: Diagnosis not present

## 2022-12-24 DIAGNOSIS — M9901 Segmental and somatic dysfunction of cervical region: Secondary | ICD-10-CM | POA: Diagnosis not present

## 2022-12-24 DIAGNOSIS — M9902 Segmental and somatic dysfunction of thoracic region: Secondary | ICD-10-CM | POA: Diagnosis not present

## 2022-12-28 DIAGNOSIS — M9904 Segmental and somatic dysfunction of sacral region: Secondary | ICD-10-CM | POA: Diagnosis not present

## 2022-12-28 DIAGNOSIS — M9902 Segmental and somatic dysfunction of thoracic region: Secondary | ICD-10-CM | POA: Diagnosis not present

## 2022-12-28 DIAGNOSIS — M9903 Segmental and somatic dysfunction of lumbar region: Secondary | ICD-10-CM | POA: Diagnosis not present

## 2022-12-28 DIAGNOSIS — M9901 Segmental and somatic dysfunction of cervical region: Secondary | ICD-10-CM | POA: Diagnosis not present

## 2022-12-30 DIAGNOSIS — M9903 Segmental and somatic dysfunction of lumbar region: Secondary | ICD-10-CM | POA: Diagnosis not present

## 2022-12-30 DIAGNOSIS — M9901 Segmental and somatic dysfunction of cervical region: Secondary | ICD-10-CM | POA: Diagnosis not present

## 2022-12-30 DIAGNOSIS — M9902 Segmental and somatic dysfunction of thoracic region: Secondary | ICD-10-CM | POA: Diagnosis not present

## 2022-12-30 DIAGNOSIS — M9904 Segmental and somatic dysfunction of sacral region: Secondary | ICD-10-CM | POA: Diagnosis not present

## 2023-01-03 DIAGNOSIS — Z419 Encounter for procedure for purposes other than remedying health state, unspecified: Secondary | ICD-10-CM | POA: Diagnosis not present

## 2023-01-04 DIAGNOSIS — M9901 Segmental and somatic dysfunction of cervical region: Secondary | ICD-10-CM | POA: Diagnosis not present

## 2023-01-04 DIAGNOSIS — M9904 Segmental and somatic dysfunction of sacral region: Secondary | ICD-10-CM | POA: Diagnosis not present

## 2023-01-04 DIAGNOSIS — M9903 Segmental and somatic dysfunction of lumbar region: Secondary | ICD-10-CM | POA: Diagnosis not present

## 2023-01-04 DIAGNOSIS — M9902 Segmental and somatic dysfunction of thoracic region: Secondary | ICD-10-CM | POA: Diagnosis not present

## 2023-01-06 DIAGNOSIS — M9901 Segmental and somatic dysfunction of cervical region: Secondary | ICD-10-CM | POA: Diagnosis not present

## 2023-01-06 DIAGNOSIS — M9902 Segmental and somatic dysfunction of thoracic region: Secondary | ICD-10-CM | POA: Diagnosis not present

## 2023-01-06 DIAGNOSIS — M9904 Segmental and somatic dysfunction of sacral region: Secondary | ICD-10-CM | POA: Diagnosis not present

## 2023-01-06 DIAGNOSIS — M9903 Segmental and somatic dysfunction of lumbar region: Secondary | ICD-10-CM | POA: Diagnosis not present

## 2023-02-01 DIAGNOSIS — M5412 Radiculopathy, cervical region: Secondary | ICD-10-CM | POA: Diagnosis not present

## 2023-02-10 ENCOUNTER — Encounter: Payer: Self-pay | Admitting: Physician Assistant

## 2023-02-10 DIAGNOSIS — Z3041 Encounter for surveillance of contraceptive pills: Secondary | ICD-10-CM

## 2023-02-14 DIAGNOSIS — Z419 Encounter for procedure for purposes other than remedying health state, unspecified: Secondary | ICD-10-CM | POA: Diagnosis not present

## 2023-02-22 ENCOUNTER — Other Ambulatory Visit: Payer: Self-pay | Admitting: Physician Assistant

## 2023-02-22 DIAGNOSIS — Z3041 Encounter for surveillance of contraceptive pills: Secondary | ICD-10-CM

## 2023-02-22 NOTE — Telephone Encounter (Signed)
Copied from CRM [id number]. Topic: Clinical - Medication Refill >> Feb 22, 2023  3:05 PM Ivette P wrote: Most Recent Primary Care Visit:  Provider: Jomarie Longs  Department: [facility]  Visit Type: SAME DAY  Date: 11/18/2021  Medication: norgestimate-ethinyl estradiol (SPRINTEC 28) 0.25-35 MG-MCG tablet  Has the patient contacted their pharmacy? Yes (Agent: If no, request that the patient contact the pharmacy for the refill. If patient does not wish to contact the pharmacy document the reason why and proceed with request.) (Agent: If yes, when and what did the pharmacy advise?)  Is this the correct pharmacy for this prescription? Yes If no, delete pharmacy and type the correct one.  This is the patient's preferred pharmacy:  [facility] [address], [place] - [address] [place] [address] Phone: [phone] Fax: [phone]   Has the prescription been filled recently? No  Is the patient out of the medication? No, has 1 week left   Has the patient been seen for an appointment in the last year OR does the patient have an upcoming appointment? Yes  Can we respond through MyChart? Yes  Agent: Please be advised that Rx refills may take up to 3 business days. We ask that you follow-up with your pharmacy.

## 2023-02-22 NOTE — Telephone Encounter (Signed)
Duplicate request. Routed to clinic earlier this afternoon. See recent encounters.   Copied from CRM [id number]. Topic: Clinical - Medication Refill >> Feb 22, 2023  3:05 PM Ivette P wrote: Most Recent Primary Care Visit:  Provider: Jomarie Longs  Department: [facility]  Visit Type: SAME DAY  Date: 11/18/2021  Medication: norgestimate-ethinyl estradiol (SPRINTEC 28) 0.25-35 MG-MCG tablet  Has the patient contacted their pharmacy? Yes (Agent: If no, request that the patient contact the pharmacy for the refill. If patient does not wish to contact the pharmacy document the reason why and proceed with request.) (Agent: If yes, when and what did the pharmacy advise?)  Is this the correct pharmacy for this prescription? Yes If no, delete pharmacy and type the correct one.  This is the patient's preferred pharmacy:  [facility] [address], [place] - [address] [place] [address] Phone: [phone] Fax: [phone]   Has the prescription been filled recently? No  Is the patient out of the medication? No, has 1 week left   Has the patient been seen for an appointment in the last year OR does the patient have an upcoming appointment? Yes  Can we respond through MyChart? Yes  Agent: Please be advised that Rx refills may take up to 3 business days. We ask that you follow-up with your pharmacy.

## 2023-02-25 NOTE — Addendum Note (Signed)
Addended by: Chalmers Cater on: 02/25/2023 01:19 PM   Modules accepted: Orders

## 2023-02-26 MED ORDER — NORGESTIMATE-ETH ESTRADIOL 0.25-35 MG-MCG PO TABS: 1.0000 | ORAL_TABLET | Freq: Every day | ORAL | 0 refills | Status: DC

## 2023-02-26 NOTE — Addendum Note (Signed)
Addended by: Jomarie Longs on: 02/26/2023 04:25 PM   Modules accepted: Orders

## 2023-03-17 ENCOUNTER — Encounter: Payer: 59 | Admitting: Physician Assistant

## 2023-03-17 DIAGNOSIS — Z419 Encounter for procedure for purposes other than remedying health state, unspecified: Secondary | ICD-10-CM | POA: Diagnosis not present

## 2023-03-24 MED ORDER — NORGESTIMATE-ETH ESTRADIOL 0.25-35 MG-MCG PO TABS
1.0000 | ORAL_TABLET | Freq: Every day | ORAL | 0 refills | Status: AC
Start: 2023-03-24 — End: ?

## 2023-03-24 NOTE — Addendum Note (Signed)
Addended by: Jomarie Longs on: 03/24/2023 12:19 PM   Modules accepted: Orders

## 2023-04-03 DIAGNOSIS — Z419 Encounter for procedure for purposes other than remedying health state, unspecified: Secondary | ICD-10-CM | POA: Diagnosis not present

## 2023-05-15 DIAGNOSIS — Z419 Encounter for procedure for purposes other than remedying health state, unspecified: Secondary | ICD-10-CM | POA: Diagnosis not present

## 2023-06-14 DIAGNOSIS — Z419 Encounter for procedure for purposes other than remedying health state, unspecified: Secondary | ICD-10-CM | POA: Diagnosis not present

## 2023-07-15 DIAGNOSIS — Z419 Encounter for procedure for purposes other than remedying health state, unspecified: Secondary | ICD-10-CM | POA: Diagnosis not present

## 2023-08-14 DIAGNOSIS — Z419 Encounter for procedure for purposes other than remedying health state, unspecified: Secondary | ICD-10-CM | POA: Diagnosis not present

## 2023-09-14 DIAGNOSIS — Z419 Encounter for procedure for purposes other than remedying health state, unspecified: Secondary | ICD-10-CM | POA: Diagnosis not present

## 2023-10-15 DIAGNOSIS — Z419 Encounter for procedure for purposes other than remedying health state, unspecified: Secondary | ICD-10-CM | POA: Diagnosis not present

## 2024-01-14 DIAGNOSIS — Z419 Encounter for procedure for purposes other than remedying health state, unspecified: Secondary | ICD-10-CM | POA: Diagnosis not present

## 2024-02-16 ENCOUNTER — Encounter: Payer: Self-pay | Admitting: Physician Assistant

## 2024-02-20 DIAGNOSIS — R519 Headache, unspecified: Secondary | ICD-10-CM | POA: Insufficient documentation

## 2024-02-20 DIAGNOSIS — R42 Dizziness and giddiness: Secondary | ICD-10-CM | POA: Insufficient documentation
